# Patient Record
Sex: Male | Born: 1935
Health system: Southern US, Community
[De-identification: ages and names within clinical notes are randomized; demographics above are authoritative.]

## PROBLEM LIST (undated history)

## (undated) DIAGNOSIS — N400 Enlarged prostate without lower urinary tract symptoms: Secondary | ICD-10-CM

## (undated) DIAGNOSIS — I10 Essential (primary) hypertension: Secondary | ICD-10-CM

## (undated) DIAGNOSIS — E119 Type 2 diabetes mellitus without complications: Secondary | ICD-10-CM

## (undated) HISTORY — PX: COLONOSCOPY: SHX174

## (undated) HISTORY — PX: TONSILLECTOMY: SUR1361

---
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
## 2001-01-02 ENCOUNTER — Encounter (INDEPENDENT_AMBULATORY_CARE_PROVIDER_SITE_OTHER): Payer: Self-pay | Admitting: Specialist

## 2001-01-02 ENCOUNTER — Ambulatory Visit (HOSPITAL_COMMUNITY): Admission: RE | Admit: 2001-01-02 | Discharge: 2001-01-02 | Payer: Self-pay | Admitting: Gastroenterology

## 2004-02-11 ENCOUNTER — Encounter (INDEPENDENT_AMBULATORY_CARE_PROVIDER_SITE_OTHER): Payer: Self-pay | Admitting: *Deleted

## 2004-02-11 ENCOUNTER — Ambulatory Visit (HOSPITAL_COMMUNITY): Admission: RE | Admit: 2004-02-11 | Discharge: 2004-02-11 | Payer: Self-pay | Admitting: Gastroenterology

## 2005-03-01 HISTORY — PX: EYE SURGERY: SHX253

## 2009-11-20 ENCOUNTER — Encounter: Admission: RE | Admit: 2009-11-20 | Discharge: 2009-11-20 | Payer: Self-pay | Admitting: Internal Medicine

## 2010-07-17 NOTE — Procedures (Signed)
[facility]  Patient:    Dustin Henderson, Dustin Henderson Visit Number: [id number] MRN: [id number]          Service Type: END Location: ENDO Attending Physician:  Nelda Marseille Dictated by:   Petra Kuba, M.D. Proc. Date: 01/02/01 Admit Date:  01/02/2001   CC:         Soyla Murphy. Renne Crigler, M.D.   Procedure Report  PROCEDURE:  Colonoscopy with polypectomy.  INDICATIONS:  Screening.  Consent was signed after risks, benefits, methods and options were thoroughly discussed in my office.  MEDICATIONS USED:  Demerol 50, Versed 6.  DESCRIPTION OF PROCEDURE:  Rectal inspection was pertinent for external hemorrhoids.  Digital examination was negative.  The video colonoscope was inserted fairly easily and advanced around the colon to the cecum.  This did require rolling him on his back and some abdominal pressure.  There was some melanosis coli.  No obvious abnormality was seen on insertion.  The cecum was identified by the appendiceal orifice and the ileocecal valve.  The scope was slowly withdrawn.  In the hepatic flexure, a 4-5 mm sessile polyp was seen.  Snare electrocautery was applied and the polyp was suctioned through the scope and collected in the trap.  There was possibly a small bit of residual adenomatous tissue which was hot biopsied as well.  The scope was then slowly withdrawn.  No other obvious polypoid lesions, masses or other abnormalities were seen as we slowly withdrew back to the rectum except for the mild melanosis coli.  The prep was adequate.  There was some stool that was adherent to the wall and some nuts and things that could not be suctioned, but no other abnormality was seen.  Once back in the rectum, the scope was retroflexed pertinent for some internal hemorrhoids.  The scope was straightened and the air was suctioned. The scope was removed.  The patient tolerated the procedure well.  There was no evidence of any  complications.  ENDOSCOPIC DIAGNOSES: 1. Internal and external hemorrhoids. 2. Mild melanosis coli. 3. Small sessile hepatic flexure polyp status post snare and hot biopsy. 4. Otherwise, within normal limits to the cecum.  PLAN: 1. Await pathology to determine future colonic screen. 2. Will see back p.r.n. one week. 3. No aspirin or nonsteroidals. 4. Yearly rectals and guaiacs per Dr. Renne Crigler. 5. Will be happy to see her back sooner or p.r.n. Dictated by:   Petra Kuba, M.D. Attending Physician:  Nelda Marseille DD:  01/02/01 TD:  01/03/01 Job: [id number]

## 2010-07-17 NOTE — Op Note (Signed)
NAMEHYDER, Dustin Henderson             ACCOUNT NO.:  [id number]   MEDICAL RECORD NO.:  [id number]          PATIENT TYPE:  AMB   LOCATION:  ENDO                         FACILITY:  [facility]   PHYSICIAN:  Petra Kuba, M.D.    DATE OF BIRTH:  1933/03/19   DATE OF PROCEDURE:  02/11/2004  DATE OF DISCHARGE:                                 OPERATIVE REPORT   PROCEDURE:  Colonoscopy with polypectomy.   INDICATION:  The patient with history of colon polyps, due for repeat  screening.  Consent was signed after risks, benefits, methods, options  thoroughly discussed in the past.   MEDICINES USED:  1.  Demerol 70.  2.  Versed 7.   DESCRIPTION OF PROCEDURE:  Rectal inspection was pertinent for external  hemorrhoids.  Digital exam was negative.  Video colonoscope was inserted and  despite a long, looping, tortuous colon with abdominal pressure, was able to  be advanced to the cecum.  No abnormalities were seen on insertion.  The  cecum was identified by the appendiceal orifice and the ileocecal valve.  The prep was adequate.  There was some liquid stool that required washing  and suctioning.  In the mid ascending, a tiny to small polyp was seen.  We  tried to snare it initially but based on its location on the fold, it could  not be snared, and three hot biopsies of it were done, put in the first  container.  Another tiny ascending polyp was seen and was hot biopsied x 1  as well and put in the same container.  The scope was slowly withdrawn.  No  other polypoid tissues or other abnormalities were seen as we slowly  withdrew back to the rectum.  Unfortunately, in the mid descending, we got a  seed caught in the scope, could not flush it out, and we did have to switch  scopes, but no abnormalities were seen.  We did readvance the second scope  to the proximal level of the splenic flexure, past the area where we had to  withdraw, where we got the seed caught by suctioning.  Once back in the  rectum,  anorectal pull-through and retroflexion confirmed some small  hemorrhoids.  The scope was straightened and readvanced a short ways up the  left side of the colon; air was suctioned and scope removed.  The patient  tolerated the procedure well.  There was no obvious immediate complication.   ENDOSCOPIC DIAGNOSES:  1.  Internal/external hemorrhoids.  2.  Two tiny to small right-sided polyps, hot biopsied.  3.  Long, looping colon.  4.  Otherwise, within normal limits to the cecum.   PLAN:  1.  Await pathology but probably recheck colon screening in 5 years.  2.  Happy to see back p.r.n.  3.  Otherwise, return care to Dr. Renne Crigler for the customary health care      maintenance to include yearly rectals and guaiacs.      MEM/MEDQ  D:  02/11/2004  T:  02/11/2004  Job:  [id number]   cc:   Soyla Murphy. Renne Crigler, M.D.  [phone]  [address] [place]  [place] [address]  Fax: [phone]

## 2010-11-16 ENCOUNTER — Encounter (INDEPENDENT_AMBULATORY_CARE_PROVIDER_SITE_OTHER): Payer: Medicare Other | Admitting: Ophthalmology

## 2010-11-16 DIAGNOSIS — H35379 Puckering of macula, unspecified eye: Secondary | ICD-10-CM

## 2010-11-16 DIAGNOSIS — H43819 Vitreous degeneration, unspecified eye: Secondary | ICD-10-CM

## 2010-11-16 DIAGNOSIS — E11319 Type 2 diabetes mellitus with unspecified diabetic retinopathy without macular edema: Secondary | ICD-10-CM

## 2011-08-16 ENCOUNTER — Encounter (INDEPENDENT_AMBULATORY_CARE_PROVIDER_SITE_OTHER): Payer: Medicare Other | Admitting: Ophthalmology

## 2011-08-16 DIAGNOSIS — H35379 Puckering of macula, unspecified eye: Secondary | ICD-10-CM

## 2011-08-16 DIAGNOSIS — H43819 Vitreous degeneration, unspecified eye: Secondary | ICD-10-CM

## 2011-08-16 DIAGNOSIS — E11319 Type 2 diabetes mellitus with unspecified diabetic retinopathy without macular edema: Secondary | ICD-10-CM

## 2011-08-16 DIAGNOSIS — E1165 Type 2 diabetes mellitus with hyperglycemia: Secondary | ICD-10-CM

## 2011-08-16 DIAGNOSIS — E1139 Type 2 diabetes mellitus with other diabetic ophthalmic complication: Secondary | ICD-10-CM

## 2012-05-15 ENCOUNTER — Ambulatory Visit (INDEPENDENT_AMBULATORY_CARE_PROVIDER_SITE_OTHER): Payer: Self-pay | Admitting: Ophthalmology

## 2012-05-15 DIAGNOSIS — E11319 Type 2 diabetes mellitus with unspecified diabetic retinopathy without macular edema: Secondary | ICD-10-CM

## 2012-05-15 DIAGNOSIS — E1139 Type 2 diabetes mellitus with other diabetic ophthalmic complication: Secondary | ICD-10-CM

## 2012-05-15 DIAGNOSIS — E1165 Type 2 diabetes mellitus with hyperglycemia: Secondary | ICD-10-CM

## 2012-05-15 DIAGNOSIS — H35379 Puckering of macula, unspecified eye: Secondary | ICD-10-CM

## 2013-02-14 ENCOUNTER — Ambulatory Visit (INDEPENDENT_AMBULATORY_CARE_PROVIDER_SITE_OTHER): Payer: Medicare Other | Admitting: Ophthalmology

## 2013-02-14 DIAGNOSIS — E11319 Type 2 diabetes mellitus with unspecified diabetic retinopathy without macular edema: Secondary | ICD-10-CM

## 2013-02-14 DIAGNOSIS — E1139 Type 2 diabetes mellitus with other diabetic ophthalmic complication: Secondary | ICD-10-CM

## 2013-02-14 DIAGNOSIS — I10 Essential (primary) hypertension: Secondary | ICD-10-CM

## 2013-02-14 DIAGNOSIS — H35039 Hypertensive retinopathy, unspecified eye: Secondary | ICD-10-CM

## 2013-02-14 DIAGNOSIS — H43819 Vitreous degeneration, unspecified eye: Secondary | ICD-10-CM

## 2013-02-14 DIAGNOSIS — H35379 Puckering of macula, unspecified eye: Secondary | ICD-10-CM

## 2013-07-20 ENCOUNTER — Ambulatory Visit (HOSPITAL_COMMUNITY)
Admission: RE | Admit: 2013-07-20 | Discharge: 2013-07-20 | Disposition: A | Discharge status: Home / Self Care | Payer: Medicare Other | Source: Ambulatory Visit | Attending: Internal Medicine | Admitting: Internal Medicine

## 2013-07-20 ENCOUNTER — Other Ambulatory Visit: Payer: Self-pay | Admitting: Gastroenterology

## 2013-07-20 ENCOUNTER — Other Ambulatory Visit (HOSPITAL_COMMUNITY): Payer: Self-pay | Admitting: Internal Medicine

## 2013-07-20 DIAGNOSIS — R945 Abnormal results of liver function studies: Secondary | ICD-10-CM

## 2013-07-20 DIAGNOSIS — R7989 Other specified abnormal findings of blood chemistry: Secondary | ICD-10-CM

## 2013-07-20 DIAGNOSIS — R17 Unspecified jaundice: Secondary | ICD-10-CM

## 2013-07-20 DIAGNOSIS — R109 Unspecified abdominal pain: Secondary | ICD-10-CM | POA: Insufficient documentation

## 2013-07-20 DIAGNOSIS — K831 Obstruction of bile duct: Secondary | ICD-10-CM

## 2013-07-20 DIAGNOSIS — K3189 Other diseases of stomach and duodenum: Secondary | ICD-10-CM | POA: Insufficient documentation

## 2013-07-20 DIAGNOSIS — R110 Nausea: Secondary | ICD-10-CM | POA: Insufficient documentation

## 2013-07-20 DIAGNOSIS — R1084 Generalized abdominal pain: Secondary | ICD-10-CM

## 2013-07-20 DIAGNOSIS — R1013 Epigastric pain: Secondary | ICD-10-CM

## 2013-07-20 NOTE — Addendum Note (Signed)
Addended byVida Rigger on: 07/20/2013 06:36 PM   Modules accepted: Orders

## 2013-07-24 ENCOUNTER — Encounter (HOSPITAL_COMMUNITY): Payer: Medicare Other | Admitting: Anesthesiology

## 2013-07-24 ENCOUNTER — Encounter (HOSPITAL_COMMUNITY)
Admission: RE | Disposition: A | Discharge status: Home / Self Care | Payer: Self-pay | Source: Ambulatory Visit | Attending: Gastroenterology

## 2013-07-24 ENCOUNTER — Ambulatory Visit (HOSPITAL_COMMUNITY)
Admission: RE | Admit: 2013-07-24 | Discharge: 2013-07-24 | Disposition: A | Discharge status: Home / Self Care | Payer: Medicare Other | Source: Ambulatory Visit | Attending: Gastroenterology | Admitting: Gastroenterology

## 2013-07-24 ENCOUNTER — Ambulatory Visit (HOSPITAL_COMMUNITY): Admit: 2013-07-24 | Payer: Self-pay | Admitting: Gastroenterology

## 2013-07-24 ENCOUNTER — Ambulatory Visit (HOSPITAL_COMMUNITY): Payer: Medicare Other

## 2013-07-24 ENCOUNTER — Encounter (HOSPITAL_COMMUNITY): Payer: Self-pay

## 2013-07-24 ENCOUNTER — Other Ambulatory Visit: Payer: Self-pay | Admitting: Internal Medicine

## 2013-07-24 ENCOUNTER — Other Ambulatory Visit: Payer: Self-pay | Admitting: Gastroenterology

## 2013-07-24 ENCOUNTER — Ambulatory Visit (HOSPITAL_COMMUNITY): Payer: Medicare Other | Admitting: Anesthesiology

## 2013-07-24 DIAGNOSIS — K838 Other specified diseases of biliary tract: Secondary | ICD-10-CM | POA: Insufficient documentation

## 2013-07-24 DIAGNOSIS — Z79899 Other long term (current) drug therapy: Secondary | ICD-10-CM | POA: Insufficient documentation

## 2013-07-24 DIAGNOSIS — K831 Obstruction of bile duct: Secondary | ICD-10-CM | POA: Insufficient documentation

## 2013-07-24 DIAGNOSIS — R945 Abnormal results of liver function studies: Secondary | ICD-10-CM | POA: Insufficient documentation

## 2013-07-24 DIAGNOSIS — Z794 Long term (current) use of insulin: Secondary | ICD-10-CM | POA: Insufficient documentation

## 2013-07-24 DIAGNOSIS — I10 Essential (primary) hypertension: Secondary | ICD-10-CM | POA: Insufficient documentation

## 2013-07-24 DIAGNOSIS — Z9641 Presence of insulin pump (external) (internal): Secondary | ICD-10-CM | POA: Insufficient documentation

## 2013-07-24 DIAGNOSIS — E109 Type 1 diabetes mellitus without complications: Secondary | ICD-10-CM | POA: Insufficient documentation

## 2013-07-24 DIAGNOSIS — K828 Other specified diseases of gallbladder: Secondary | ICD-10-CM | POA: Insufficient documentation

## 2013-07-24 DIAGNOSIS — R109 Unspecified abdominal pain: Secondary | ICD-10-CM

## 2013-07-24 HISTORY — PX: EUS: SHX5427

## 2013-07-24 HISTORY — DX: Essential (primary) hypertension: I10

## 2013-07-24 HISTORY — DX: Type 2 diabetes mellitus without complications: E11.9

## 2013-07-24 HISTORY — PX: ERCP: SHX5425

## 2013-07-24 LAB — TYPE AND SCREEN
ABO/RH(D): O POS
Antibody Screen: NEGATIVE

## 2013-07-24 LAB — PROTIME-INR
INR: 1.04 (ref 0.00–1.49)
Prothrombin Time: 13.4 seconds (ref 11.6–15.2)

## 2013-07-24 LAB — ABO/RH: ABO/RH(D): O POS

## 2013-07-24 LAB — CBC
HCT: 38 % — ABNORMAL LOW (ref 39.0–52.0)
HEMOGLOBIN: 13.1 g/dL (ref 13.0–17.0)
MCH: 31.2 pg (ref 26.0–34.0)
MCHC: 34.5 g/dL (ref 30.0–36.0)
MCV: 90.5 fL (ref 78.0–100.0)
Platelets: 199 10*3/uL (ref 150–400)
RBC: 4.2 MIL/uL — ABNORMAL LOW (ref 4.22–5.81)
RDW: 17.6 % — ABNORMAL HIGH (ref 11.5–15.5)
WBC: 7.5 10*3/uL (ref 4.0–10.5)

## 2013-07-24 LAB — COMPREHENSIVE METABOLIC PANEL
ALK PHOS: 370 U/L — AB (ref 39–117)
ALT: 89 U/L — ABNORMAL HIGH (ref 0–53)
AST: 117 U/L — ABNORMAL HIGH (ref 0–37)
Albumin: 2.8 g/dL — ABNORMAL LOW (ref 3.5–5.2)
BUN: 17 mg/dL (ref 6–23)
CO2: 25 mEq/L (ref 19–32)
CREATININE: 0.78 mg/dL (ref 0.50–1.35)
Calcium: 9.3 mg/dL (ref 8.4–10.5)
Chloride: 100 mEq/L (ref 96–112)
GFR, EST NON AFRICAN AMERICAN: 85 mL/min — AB (ref >90)
GLUCOSE: 222 mg/dL — AB (ref 70–99)
POTASSIUM: 4.2 meq/L (ref 3.7–5.3)
Sodium: 138 mEq/L (ref 137–147)
Total Bilirubin: 21.2 mg/dL (ref 0.3–1.2)
Total Protein: 5.8 g/dL — ABNORMAL LOW (ref 6.0–8.3)

## 2013-07-24 LAB — GLUCOSE, CAPILLARY
Glucose-Capillary: 191 mg/dL — ABNORMAL HIGH (ref 70–99)
Glucose-Capillary: 203 mg/dL — ABNORMAL HIGH (ref 70–99)

## 2013-07-24 SURGERY — ESOPHAGEAL ENDOSCOPIC ULTRASOUND (EUS) RADIAL
Anesthesia: General

## 2013-07-24 SURGERY — ERCP, WITH INTERVENTION IF INDICATED
Anesthesia: General

## 2013-07-24 MED ORDER — ONDANSETRON HCL 4 MG/2ML IJ SOLN
INTRAMUSCULAR | Status: DC | PRN
  Administered 2013-07-24: 4 mg via INTRAVENOUS

## 2013-07-24 MED ORDER — PROPOFOL 10 MG/ML IV BOLUS
INTRAVENOUS | Status: AC
  Filled 2013-07-24: qty 20

## 2013-07-24 MED ORDER — BUTAMBEN-TETRACAINE-BENZOCAINE 2-2-14 % EX AERO
INHALATION_SPRAY | CUTANEOUS | Status: DC | PRN
  Administered 2013-07-24: 2 via TOPICAL

## 2013-07-24 MED ORDER — SODIUM CHLORIDE 0.9 % IV SOLN: INTRAVENOUS | Status: DC

## 2013-07-24 MED ORDER — DEXTROSE 5 % IV SOLN
2.0000 g | Freq: Once | INTRAVENOUS | Status: AC
  Administered 2013-07-24: 2 g via INTRAVENOUS
  Filled 2013-07-24: qty 2

## 2013-07-24 MED ORDER — FENTANYL CITRATE 0.05 MG/ML IJ SOLN
INTRAMUSCULAR | Status: DC | PRN
  Administered 2013-07-24: 50 ug via INTRAVENOUS

## 2013-07-24 MED ORDER — LIDOCAINE HCL (CARDIAC) 20 MG/ML IV SOLN
INTRAVENOUS | Status: AC
  Filled 2013-07-24: qty 5

## 2013-07-24 MED ORDER — ONDANSETRON HCL 4 MG/2ML IJ SOLN
INTRAMUSCULAR | Status: AC
  Filled 2013-07-24: qty 2

## 2013-07-24 MED ORDER — LIDOCAINE HCL (CARDIAC) 20 MG/ML IV SOLN
INTRAVENOUS | Status: DC | PRN
  Administered 2013-07-24: 50 mg via INTRAVENOUS

## 2013-07-24 MED ORDER — FENTANYL CITRATE 0.05 MG/ML IJ SOLN: 25.0000 ug | INTRAMUSCULAR | Status: DC | PRN

## 2013-07-24 MED ORDER — LACTATED RINGERS IV SOLN
INTRAVENOUS | Status: DC
  Administered 2013-07-24 (×2): via INTRAVENOUS

## 2013-07-24 MED ORDER — LIDOCAINE HCL (CARDIAC) 20 MG/ML IV SOLN
INTRAVENOUS | Status: AC
Start: 2013-07-24 — End: 2013-07-24
  Filled 2013-07-24: qty 5

## 2013-07-24 MED ORDER — PHENYLEPHRINE HCL 10 MG/ML IJ SOLN
INTRAMUSCULAR | Status: DC | PRN
  Administered 2013-07-24 (×3): 40 ug via INTRAVENOUS

## 2013-07-24 MED ORDER — SODIUM CHLORIDE 0.9 % IV SOLN
INTRAVENOUS | Status: DC | PRN
  Administered 2013-07-24: 14:00:00

## 2013-07-24 MED ORDER — SUCCINYLCHOLINE CHLORIDE 20 MG/ML IJ SOLN
INTRAMUSCULAR | Status: DC | PRN
  Administered 2013-07-24: 100 mg via INTRAVENOUS

## 2013-07-24 MED ORDER — FENTANYL CITRATE 0.05 MG/ML IJ SOLN
INTRAMUSCULAR | Status: AC
  Filled 2013-07-24: qty 2

## 2013-07-24 MED ORDER — PROPOFOL 10 MG/ML IV BOLUS
INTRAVENOUS | Status: DC | PRN
  Administered 2013-07-24: 30 mg via INTRAVENOUS
  Administered 2013-07-24: 150 mg via INTRAVENOUS
  Administered 2013-07-24: 50 mg via INTRAVENOUS
  Administered 2013-07-24: 30 mg via INTRAVENOUS

## 2013-07-24 NOTE — Progress Notes (Signed)
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

## 2013-07-24 NOTE — Op Note (Signed)
[facility] [address] [place], [address]   ENDOSCOPIC ULTRASOUND PROCEDURE REPORT  PATIENT: Dustin Henderson, Dustin Henderson  MR#: [id number] BIRTHDATE: 1933-09-13  GENDER: Male ENDOSCOPIST: Willis Modena, MD REFERRED BY:  Romero Liner, M.D.  Vida Rigger, M.D. PROCEDURE DATE:  07/24/2013 PROCEDURE:   Upper EUS ASA CLASS:      Class III INDICATIONS:   1.  obstructive jaundice. MEDICATIONS: General endotracheal anesthesia (GETA)  DESCRIPTION OF PROCEDURE:   After the risks benefits and alternatives of the procedure were  explained, informed consent was obtained. The patient was then placed in the left, lateral, decubitus postion and IV sedation was administered. Throughout the procedure, the patients blood pressure, pulse and oxygen saturations were monitored continuously.  Under direct visualization, the linear oblique-viewing  echoendoscope was introduced through the mouth  and advanced to the second portion of the duodenum .  Water was used as necessary to provide an acoustic interface.  Upon completion of the imaging, water was removed and the patient was sent to the recovery room in satisfactory condition.    FINDINGS:      Ampulla normal-appearing via endoscopic ultrasound and endoscopically.  The extrahepatic bile duct was diffusely dilated (52mm common hepatic duct, 74mm distal common bile duct). The gallbladder was dilated and filled with sludge.  The bile duct had lots of gravity-dependent sludge.  In the very distal common bile duct, there was a 61mm region most consistent with a poorly-shadowing stone.  The bile duct is diffusely dilated right to the point of the ampulla, at which point the duct abruptly terminates. There is no bile duct wall irregularity, and no evidence of bile duct mass seen on EUS.  I evaluated the uncinate and head of pancreas extensively; I did not see any evidence of a pancreatic mass in this area either.   No adenopathy was seen  in the region of the head, uncinate and ampulla.  IMPRESSION:     As above.  No evidence of pancreatic mass. Differential considerations include gallstone-related papillary stenosis versus very small distal bile duct stricture (benign stone-related versus very subtle cholangiocarcinoma) versus very small pancreatic mass not visible on EUS (unlikely).  RECOMMENDATIONS:     1.  Watch for potential complications of procedure. 2.  Proceed with ERCP today with Dr. Ewing Schlein. 3.  Case reviewed with Dr. Ewing Schlein.   _______________________________ Rosalie DoctorWillis Modena, MD 07/24/2013 12:55 PM   CC:

## 2013-07-24 NOTE — Transfer of Care (Signed)
Immediate Anesthesia Transfer of Care Note  Patient: Dustin Henderson  Procedure(s) Performed: Procedure(s) with comments: ENDOSCOPIC RETROGRADE CHOLANGIOPANCREATOGRAPHY (ERCP) (N/A) - have stents available  Patient Location: PACU  Anesthesia Type:General  Level of Consciousness: awake, alert  and oriented  Airway & Oxygen Therapy: Patient Spontanous Breathing and Patient connected to face mask oxygen  Post-op Assessment: Report given to PACU RN and Post -op Vital signs reviewed and stable  Post vital signs: Reviewed and stable  Complications: No apparent anesthesia complications

## 2013-07-24 NOTE — Progress Notes (Signed)
CRITICAL VALUE ALERT  Critical value received:  t bili  Date of notification:  07/24/13  Time of notification:  1225  Critical value read back:yes  Nurse who received alert:  Riki Rusk, RN  MD notified (1st page):  Dr Ewing Schlein  Time of first page:  1225  MD notified (2nd page):  Time of second page:  Responding MD:  Dr Ewing Schlein  Time MD responded:  1225

## 2013-07-24 NOTE — Op Note (Signed)
[facility] [address] [place], [address]   ERCP PROCEDURE REPORT  PATIENT: Dustin Henderson, Dustin Henderson  MR# :[id number] BIRTHDATE: 09/25/1933  GENDER: Male ENDOSCOPIST: Vida Rigger, MD REFERRED BY: Romero Liner, M.D. PROCEDURE DATE:  07/24/2013 PROCEDURE:   ERCP with sphincterotomy/papillotomy, ERCP with stent placement, and ERCP with balloon dilation and brushing ASA CLASS:    2 INDICATIONS: obstructive jaundice MEDICATIONS:   general anesthesia TOPICAL ANESTHETIC:  none  DESCRIPTION OF PROCEDURE:   After the risks benefits and alternatives of the procedure were thoroughly explained, informed consent was obtained.  The ercp pentax [id number]  endoscope was introduced through the mouth and advanced to the second portion of the duodenum .I watched most of Dr. Dulce Sellar as EUS prior to our ERCP and deep selective cannulation was obtained and a massively dilated CBD was confirmed with the very distal short stricture and we went ahead and proceeded with a sphincterotomy x2which was increased after we're unable to withdrawal the balloon the first time in the customary but despite multiple attempts at withdrawing the 12-15 mm adjustable balloon it continued to get caught up in the stricture so we went ahead and balloon dilated the stricture using the 8 mm balloon but we still could not readily pass the balloon so we sent the balloon dilating catheter for cytology and went ahead and brushed the distal duct and then placed a 4 cm metal removable stent in the distal CBD with adequate biliary drainage in the customary fashion and no CBD stones were seen and no pancreatic injections or wires advancements were done throughout the procedure the scope was removed and the patient tolerated the procedure well there was no obvious immediate complication           COMPLICATIONS:  none  ENDOSCOPIC IMPRESSION: 1.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 No obvious CBD stone seen that unable to withdraw adjustable balloon as above past the stricture without decreasing the balloon 4 no Pd wire advancements or injections as above  RECOMMENDATIONS:customary post-ERCP care and instructions and will see back in the office next week to repeat liver tests and probably consider CAT scan just to be sure and await cytology in the meantime     _______________________________ eSigned:  Vida Rigger, MD 07/24/2013 2:29 PM   TX:MIWOEH D Renne Crigler, MD  PATIENT NAMEDeionte, Delledonne MR#: [id number]

## 2013-07-24 NOTE — Discharge Instructions (Signed)
Call if question or problem otherwise if doing well at 4 PM may have sips of clear liquid and if doing well at 8 PM may have soft solids and slowly advanced diet. Specifically call if increased pain nausea vomiting fever or signs of GI bleeding otherwise followup with me in the office next week to recheck labs and decide any other workup and plans like probable CAT scan and we will call if we get the tissue sample resultsEndoscopic Retrograde Cholangiopancreatography (ERCP), Care After Refer to this sheet in the next few weeks. These instructions provide you with information on caring for yourself after your procedure. Your health care provider may also give you more specific instructions. Your treatment has been planned according to current medical practices, but problems sometimes occur. Call your health care provider if you have any problems or questions after your procedure.  WHAT TO EXPECT AFTER THE PROCEDURE  After your procedure, it is typical to feel:   Soreness in your throat.   Sick to your stomach (nauseous).   Bloated.  Dizzy.   Fatigued. HOME CARE INSTRUCTIONS  Have a friend or family member stay with you for the first 24 hours after your procedure.  Start taking your usual medicines and eating normally as soon as you feel well enough to do so or as directed by your health care provider. SEEK MEDICAL CARE IF:  You have abdominal pain.   You develop signs of infection, such as:   Chills.   Feeling unwell.  SEEK IMMEDIATE MEDICAL CARE IF:  You have difficulty swallowing.  You have worsening throat, chest, or abdominal pain.  You vomit.  You have bloody or very black stools.  You have a fever. Document Released: 12/06/2012 Document Reviewed: 08/21/2012 [facility] Patient Information 2014 [place], [place]. General Anesthesia, Adult, Care After Refer to this sheet in the next few weeks. These instructions provide you with information on caring for yourself after  your procedure. Your health care provider may also give you more specific instructions. Your treatment has been planned according to current medical practices, but problems sometimes occur. Call your health care provider if you have any problems or questions after your procedure. WHAT TO EXPECT AFTER THE PROCEDURE After the procedure, it is typical to experience:  Sleepiness.  Nausea and vomiting. HOME CARE INSTRUCTIONS  For the first 24 hours after general anesthesia:  Have a responsible person with you.  Do not drive a car. If you are alone, do not take public transportation.  Do not drink alcohol.  Do not take medicine that has not been prescribed by your health care provider.  Do not sign important papers or make important decisions.  You may resume a normal diet and activities as directed by your health care provider.  Change bandages (dressings) as directed.  If you have questions or problems that seem related to general anesthesia, call the hospital and ask for the anesthetist or anesthesiologist on call. SEEK MEDICAL CARE IF:  You have nausea and vomiting that continue the day after anesthesia.  You develop a rash. SEEK IMMEDIATE MEDICAL CARE IF:   You have difficulty breathing.  You have chest pain.  You have any allergic problems. Document Released: 05/24/2000 Document Revised: 10/18/2012 Document Reviewed: 08/31/2012 [facility] Patient Information 2014 [place], [place].

## 2013-07-24 NOTE — Anesthesia Preprocedure Evaluation (Addendum)
Anesthesia Evaluation  Patient identified by MRN, date of birth, ID band Patient awake    Reviewed: Allergy & Precautions, H&P , NPO status , Patient's Chart, lab work & pertinent test results  Airway Mallampati: II TM Distance: >3 FB Neck ROM: Full    Dental no notable dental hx.    Pulmonary neg pulmonary ROS,  breath sounds clear to auscultation  Pulmonary exam normal       Cardiovascular hypertension, Pt. on medications Rhythm:Regular Rate:Normal     Neuro/Psych negative neurological ROS  negative psych ROS   GI/Hepatic negative GI ROS, Elevated liver function tests.   Endo/Other  diabetes, Type 1, Insulin DependentInsulin pump at home.  Renal/GU negative Renal ROS  negative genitourinary   Musculoskeletal negative musculoskeletal ROS (+)   Abdominal   Peds negative pediatric ROS (+)  Hematology negative hematology ROS (+)   Anesthesia Other Findings   Reproductive/Obstetrics negative OB ROS                         Anesthesia Physical Anesthesia Plan  ASA: III  Anesthesia Plan: General   Post-op Pain Management:    Induction: Intravenous  Airway Management Planned: Oral ETT  Additional Equipment:   Intra-op Plan:   Post-operative Plan: Extubation in OR  Informed Consent: I have reviewed the patients History and Physical, chart, labs and discussed the procedure including the risks, benefits and alternatives for the proposed anesthesia with the patient or authorized representative who has indicated his/her understanding and acceptance.   Dental advisory given  Plan Discussed with: CRNA  Anesthesia Plan Comments:        Anesthesia Quick Evaluation

## 2013-07-25 ENCOUNTER — Encounter (HOSPITAL_COMMUNITY): Payer: Self-pay | Admitting: Gastroenterology

## 2013-07-25 NOTE — Anesthesia Postprocedure Evaluation (Signed)
  Anesthesia Post-op Note  Patient: Dustin Henderson  Procedure(s) Performed: Procedure(s) (LRB): ENDOSCOPIC RETROGRADE CHOLANGIOPANCREATOGRAPHY (ERCP) (N/A) ESOPHAGEAL ENDOSCOPIC ULTRASOUND (EUS) RADIAL  Patient Location: PACU  Anesthesia Type: General  Level of Consciousness: awake and alert   Airway and Oxygen Therapy: Patient Spontanous Breathing  Post-op Pain: mild  Post-op Assessment: Post-op Vital signs reviewed, Patient's Cardiovascular Status Stable, Respiratory Function Stable, Patent Airway and No signs of Nausea or vomiting  Last Vitals:  Filed Vitals:   07/24/13 1530  BP: 114/50  Temp:   Resp: 17    Post-op Vital Signs: stable   Complications: No apparent anesthesia complications

## 2013-07-30 ENCOUNTER — Other Ambulatory Visit: Payer: Self-pay | Admitting: Gastroenterology

## 2013-07-30 DIAGNOSIS — R740 Nonspecific elevation of levels of transaminase and lactic acid dehydrogenase [LDH]: Principal | ICD-10-CM

## 2013-07-30 DIAGNOSIS — R7401 Elevation of levels of liver transaminase levels: Secondary | ICD-10-CM

## 2013-07-30 DIAGNOSIS — R7402 Elevation of levels of lactic acid dehydrogenase (LDH): Secondary | ICD-10-CM

## 2013-08-03 ENCOUNTER — Ambulatory Visit
Admission: RE | Admit: 2013-08-03 | Discharge: 2013-08-03 | Disposition: A | Discharge status: Home / Self Care | Payer: Medicare Other | Source: Ambulatory Visit | Attending: Gastroenterology | Admitting: Gastroenterology

## 2013-08-03 DIAGNOSIS — R7401 Elevation of levels of liver transaminase levels: Secondary | ICD-10-CM

## 2013-08-03 DIAGNOSIS — R740 Nonspecific elevation of levels of transaminase and lactic acid dehydrogenase [LDH]: Principal | ICD-10-CM

## 2013-08-03 MED ORDER — IOHEXOL 300 MG/ML  SOLN
100.0000 mL | Freq: Once | INTRAMUSCULAR | Status: AC | PRN
  Administered 2013-08-03: 100 mL via INTRAVENOUS

## 2013-08-30 ENCOUNTER — Encounter (HOSPITAL_COMMUNITY): Payer: Self-pay | Admitting: Pharmacy Technician

## 2013-09-10 ENCOUNTER — Encounter (HOSPITAL_COMMUNITY): Payer: Self-pay | Admitting: *Deleted

## 2013-09-12 ENCOUNTER — Other Ambulatory Visit: Payer: Self-pay | Admitting: Gastroenterology

## 2013-09-13 NOTE — Addendum Note (Signed)
Addended by: Grove Defina on: 09/13/2013 08:13 AM   Modules accepted: Orders  

## 2013-09-19 ENCOUNTER — Encounter (HOSPITAL_COMMUNITY): Payer: Self-pay | Admitting: Anesthesiology

## 2013-09-19 NOTE — Anesthesia Preprocedure Evaluation (Addendum)
Anesthesia Evaluation  Patient identified by MRN, date of birth, ID band Patient awake    Reviewed: Allergy & Precautions, H&P , NPO status , Patient's Chart, lab work & pertinent test results  Airway Mallampati: II TM Distance: >3 FB Neck ROM: Full    Dental no notable dental hx.    Pulmonary neg pulmonary ROS,  breath sounds clear to auscultation  Pulmonary exam normal       Cardiovascular hypertension, Pt. on medications negative cardio ROS  Rhythm:Regular Rate:Normal     Neuro/Psych negative neurological ROS  negative psych ROS   GI/Hepatic negative GI ROS, Neg liver ROS,   Endo/Other  diabetes, Type 1, Insulin Dependent  Renal/GU negative Renal ROS  negative genitourinary   Musculoskeletal negative musculoskeletal ROS (+)   Abdominal   Peds negative pediatric ROS (+)  Hematology negative hematology ROS (+)   Anesthesia Other Findings   Reproductive/Obstetrics negative OB ROS                          Anesthesia Physical Anesthesia Plan  ASA: III  Anesthesia Plan: MAC   Post-op Pain Management:    Induction: Intravenous  Airway Management Planned: Oral ETT  Additional Equipment:   Intra-op Plan:   Post-operative Plan: Extubation in OR  Informed Consent: I have reviewed the patients History and Physical, chart, labs and discussed the procedure including the risks, benefits and alternatives for the proposed anesthesia with the patient or authorized representative who has indicated his/her understanding and acceptance.   Dental advisory given  Plan Discussed with: CRNA  Anesthesia Plan Comments: (MAC with GA backup)       Anesthesia Quick Evaluation

## 2013-09-20 ENCOUNTER — Encounter (HOSPITAL_COMMUNITY): Payer: Self-pay | Admitting: Anesthesiology

## 2013-09-20 ENCOUNTER — Ambulatory Visit (HOSPITAL_COMMUNITY): Payer: Medicare Other

## 2013-09-20 ENCOUNTER — Encounter (HOSPITAL_COMMUNITY)
Admission: RE | Disposition: A | Discharge status: Home / Self Care | Payer: Self-pay | Source: Ambulatory Visit | Attending: Gastroenterology

## 2013-09-20 ENCOUNTER — Ambulatory Visit (HOSPITAL_COMMUNITY)
Admission: RE | Admit: 2013-09-20 | Discharge: 2013-09-20 | Disposition: A | Discharge status: Home / Self Care | Payer: Medicare Other | Source: Ambulatory Visit | Attending: Gastroenterology | Admitting: Gastroenterology

## 2013-09-20 ENCOUNTER — Encounter (HOSPITAL_COMMUNITY): Payer: Medicare Other | Admitting: Anesthesiology

## 2013-09-20 ENCOUNTER — Ambulatory Visit (HOSPITAL_COMMUNITY): Payer: Medicare Other | Admitting: Anesthesiology

## 2013-09-20 DIAGNOSIS — K838 Other specified diseases of biliary tract: Secondary | ICD-10-CM | POA: Diagnosis not present

## 2013-09-20 DIAGNOSIS — I10 Essential (primary) hypertension: Secondary | ICD-10-CM | POA: Diagnosis not present

## 2013-09-20 DIAGNOSIS — E109 Type 1 diabetes mellitus without complications: Secondary | ICD-10-CM | POA: Insufficient documentation

## 2013-09-20 DIAGNOSIS — Z79899 Other long term (current) drug therapy: Secondary | ICD-10-CM | POA: Insufficient documentation

## 2013-09-20 DIAGNOSIS — Z4682 Encounter for fitting and adjustment of non-vascular catheter: Secondary | ICD-10-CM | POA: Insufficient documentation

## 2013-09-20 DIAGNOSIS — Z794 Long term (current) use of insulin: Secondary | ICD-10-CM | POA: Diagnosis not present

## 2013-09-20 HISTORY — DX: Benign prostatic hyperplasia without lower urinary tract symptoms: N40.0

## 2013-09-20 HISTORY — PX: SPYGLASS CHOLANGIOSCOPY: SHX5441

## 2013-09-20 HISTORY — PX: ERCP: SHX5425

## 2013-09-20 LAB — CBC WITH DIFFERENTIAL/PLATELET
Basophils Absolute: 0.1 10*3/uL (ref 0.0–0.1)
Basophils Relative: 1 % (ref 0–1)
Eosinophils Absolute: 0.3 10*3/uL (ref 0.0–0.7)
Eosinophils Relative: 4 % (ref 0–5)
HEMATOCRIT: 36.6 % — AB (ref 39.0–52.0)
Hemoglobin: 12.7 g/dL — ABNORMAL LOW (ref 13.0–17.0)
LYMPHS PCT: 60 % — AB (ref 12–46)
Lymphs Abs: 4.4 10*3/uL — ABNORMAL HIGH (ref 0.7–4.0)
MCH: 31.2 pg (ref 26.0–34.0)
MCHC: 34.7 g/dL (ref 30.0–36.0)
MCV: 89.9 fL (ref 78.0–100.0)
MONO ABS: 0.3 10*3/uL (ref 0.1–1.0)
Monocytes Relative: 4 % (ref 3–12)
Neutro Abs: 2.2 10*3/uL (ref 1.7–7.7)
Neutrophils Relative %: 31 % — ABNORMAL LOW (ref 43–77)
Platelets: 148 10*3/uL — ABNORMAL LOW (ref 150–400)
RBC: 4.07 MIL/uL — AB (ref 4.22–5.81)
RDW: 13.1 % (ref 11.5–15.5)
WBC: 7.2 10*3/uL (ref 4.0–10.5)

## 2013-09-20 LAB — COMPREHENSIVE METABOLIC PANEL
ALT: 14 U/L (ref 0–53)
AST: 23 U/L (ref 0–37)
Albumin: 3.5 g/dL (ref 3.5–5.2)
Alkaline Phosphatase: 80 U/L (ref 39–117)
Anion gap: 12 (ref 5–15)
BILIRUBIN TOTAL: 1.4 mg/dL — AB (ref 0.3–1.2)
BUN: 24 mg/dL — ABNORMAL HIGH (ref 6–23)
CHLORIDE: 95 meq/L — AB (ref 96–112)
CO2: 27 meq/L (ref 19–32)
CREATININE: 0.87 mg/dL (ref 0.50–1.35)
Calcium: 9.1 mg/dL (ref 8.4–10.5)
GFR calc Af Amer: >90 mL/min (ref >90)
GFR, EST NON AFRICAN AMERICAN: 81 mL/min — AB (ref >90)
Glucose, Bld: 314 mg/dL — ABNORMAL HIGH (ref 70–99)
Potassium: 4.3 mEq/L (ref 3.7–5.3)
SODIUM: 134 meq/L — AB (ref 137–147)
Total Protein: 6.1 g/dL (ref 6.0–8.3)

## 2013-09-20 LAB — PROTIME-INR
INR: 1.01 (ref 0.00–1.49)
Prothrombin Time: 13.3 seconds (ref 11.6–15.2)

## 2013-09-20 LAB — GLUCOSE, CAPILLARY: Glucose-Capillary: 284 mg/dL — ABNORMAL HIGH (ref 70–99)

## 2013-09-20 LAB — TYPE AND SCREEN
ABO/RH(D): O POS
Antibody Screen: NEGATIVE

## 2013-09-20 SURGERY — ERCP, WITH INTERVENTION IF INDICATED
Anesthesia: Monitor Anesthesia Care

## 2013-09-20 MED ORDER — PROPOFOL 10 MG/ML IV BOLUS
INTRAVENOUS | Status: AC
  Filled 2013-09-20: qty 20

## 2013-09-20 MED ORDER — LABETALOL HCL 5 MG/ML IV SOLN
INTRAVENOUS | Status: DC | PRN
  Administered 2013-09-20: 5 mg via INTRAVENOUS

## 2013-09-20 MED ORDER — PROPOFOL 10 MG/ML IV BOLUS
INTRAVENOUS | Status: DC | PRN
  Administered 2013-09-20: 150 mg via INTRAVENOUS

## 2013-09-20 MED ORDER — PHENYLEPHRINE HCL 10 MG/ML IJ SOLN
INTRAMUSCULAR | Status: DC | PRN
  Administered 2013-09-20 (×2): 40 ug via INTRAVENOUS

## 2013-09-20 MED ORDER — GLYCOPYRROLATE 0.2 MG/ML IJ SOLN
INTRAMUSCULAR | Status: AC
  Filled 2013-09-20: qty 1

## 2013-09-20 MED ORDER — DEXTROSE 5 % IV SOLN
1.0000 g | Freq: Once | INTRAVENOUS | Status: AC
  Administered 2013-09-20: 2 g via INTRAVENOUS
  Filled 2013-09-20: qty 1

## 2013-09-20 MED ORDER — LIDOCAINE HCL (CARDIAC) 20 MG/ML IV SOLN
INTRAVENOUS | Status: AC
  Filled 2013-09-20: qty 5

## 2013-09-20 MED ORDER — FENTANYL CITRATE 0.05 MG/ML IJ SOLN
INTRAMUSCULAR | Status: DC | PRN
  Administered 2013-09-20 (×2): 50 ug via INTRAVENOUS

## 2013-09-20 MED ORDER — LABETALOL HCL 5 MG/ML IV SOLN
INTRAVENOUS | Status: AC
  Filled 2013-09-20: qty 4

## 2013-09-20 MED ORDER — GLYCOPYRROLATE 0.2 MG/ML IJ SOLN
INTRAMUSCULAR | Status: DC | PRN
  Administered 2013-09-20: 0.2 mg via INTRAVENOUS

## 2013-09-20 MED ORDER — IOHEXOL 350 MG/ML SOLN
INTRAVENOUS | Status: DC | PRN
  Administered 2013-09-20: 09:00:00

## 2013-09-20 MED ORDER — FENTANYL CITRATE 0.05 MG/ML IJ SOLN
INTRAMUSCULAR | Status: AC
  Filled 2013-09-20: qty 2

## 2013-09-20 MED ORDER — LACTATED RINGERS IV SOLN
INTRAVENOUS | Status: DC | PRN
  Administered 2013-09-20: 08:00:00 via INTRAVENOUS

## 2013-09-20 MED ORDER — SUCCINYLCHOLINE CHLORIDE 20 MG/ML IJ SOLN
INTRAMUSCULAR | Status: DC | PRN
  Administered 2013-09-20: 100 mg via INTRAVENOUS

## 2013-09-20 MED ORDER — LIDOCAINE HCL (CARDIAC) 20 MG/ML IV SOLN
INTRAVENOUS | Status: DC | PRN
  Administered 2013-09-20: 100 mg via INTRAVENOUS

## 2013-09-20 MED ORDER — SODIUM CHLORIDE 0.9 % IV SOLN: INTRAVENOUS | Status: DC

## 2013-09-20 MED ORDER — PHENYLEPHRINE 40 MCG/ML (10ML) SYRINGE FOR IV PUSH (FOR BLOOD PRESSURE SUPPORT)
PREFILLED_SYRINGE | INTRAVENOUS | Status: AC
  Filled 2013-09-20: qty 10

## 2013-09-20 NOTE — Transfer of Care (Signed)
Immediate Anesthesia Transfer of Care Note  Patient: Dustin Henderson  Procedure(s) Performed: Procedure(s): ENDOSCOPIC RETROGRADE CHOLANGIOPANCREATOGRAPHY (ERCP) (N/A) SPYGLASS CHOLANGIOSCOPY (N/A)  Patient Location: PACU  Anesthesia Type:General  Level of Consciousness: awake and oriented  Airway & Oxygen Therapy: Patient Spontanous Breathing and Patient connected to nasal cannula oxygen  Post-op Assessment: Report given to PACU RN and Post -op Vital signs reviewed and stable  Post vital signs: Reviewed and stable  Complications: No apparent anesthesia complications

## 2013-09-20 NOTE — Progress Notes (Signed)
Dustin Henderson 7:47 AM  Subjective: Patient asymptomatic from a GI standpoint and no medical problems since I saw Dustin Henderson in the office  Objective: Vital signs stable afebrile no acute distress exam please see pre-assessment evaluation  Assessment: Obstructive jaundice status post stent  Plan: Okay to proceed with stent removal and repeat ERCP to reevaluate questionable stricture  Dustin Henderson E

## 2013-09-20 NOTE — Op Note (Signed)
[facility] [address] [place], [id number]   ERCP PROCEDURE REPORT  PATIENT: Dustin CuriaCockrell, Rashaun  MR# :[id number] BIRTHDATE: 01-Nov-1933  GENDER: Male ENDOSCOPIST: Vida RiggerMarc Jinny Sweetland, MD REFERRED BY: Romero LinerWalter D Pharr, M.D. PROCEDURE DATE:  09/20/2013 PROCEDURE:   ERCP with biopsy ASA CLASS:    2 INDICATIONS: CBD stricture MEDICATIONS:  Gen. TOPICAL ANESTHETIC:  no  DESCRIPTION OF PROCEDURE:   After the risks benefits and alternatives of the procedure were thoroughly explained, informed consent was obtained.  The ercp pentax [id number]  endoscope was introduced through the mouth and advanced to the second portion of the duodenum .the old stent was brought into view and removed using the rat-tooth forceps in the customary fashion and the stent was sent for cytology and the scope was reinserted and easily cannulated with the adjustable balloon and we proceeded with multiple balloon pull-throughs was some old debris being delivered but no obvious stone and no resistance to the 12 mm balloon and only minimal resistance to the 15 mm balloon and there was adequate biliary drainage and no obvious finding on occlusion cholangiogram and the JAG Jagwire was inserted into the intrahepatics and the balloon catheter was exchanged for the spyglass mother-daughter scope and excellent visualization of the CBD was obtained on multiple advanced and withdrawals and no obvious mass lesion was seen and we did take a few biopsies of the edematous area of the distal duct and the wire and the smaller scope was removed and then the ERCP scope was removed and the patient tolerated the procedure well there was no obvious immediate complications and there was no pancreatic duct injection or wire enhancement throughout the procedure         COMPLICATIONS: none  ENDOSCOPIC IMPRESSION:1 status post metal stent removal and sent for cytology 2 minimal debris removed on multiple balloon  pull-through as above 3 essentially negative CBD enteroscope with a few biopsies of the edematous distal duct 4. No pancreatic duct injection or wire advancement  RECOMMENDATIONS:observe for delayed complications slowly advance diet await cytology and biopsy and followup in 2 months if doing well or when necessary     _______________________________ eSigned:  Vida RiggerMarc Federico Maiorino, MD 09/20/2013 9:13 AM   AO:ZHYQMVCC:Walter D Renne CriglerPharr, MD  PATIENT NAMSimone Henderson:  Silveria, Deandre MR#: [id number]

## 2013-09-20 NOTE — Anesthesia Postprocedure Evaluation (Signed)
  Anesthesia Post-op Note  Patient: Dustin Henderson  Procedure(s) Performed: Procedure(s) (LRB): ENDOSCOPIC RETROGRADE CHOLANGIOPANCREATOGRAPHY (ERCP) (N/A) SPYGLASS CHOLANGIOSCOPY (N/A)  Patient Location: PACU  Anesthesia Type: General  Level of Consciousness: awake and alert   Airway and Oxygen Therapy: Patient Spontanous Breathing  Post-op Pain: mild  Post-op Assessment: Post-op Vital signs reviewed, Patient's Cardiovascular Status Stable, Respiratory Function Stable, Patent Airway and No signs of Nausea or vomiting  Last Vitals:  Filed Vitals:   09/20/13 0950  BP: 100/47  Pulse: 68  Temp: 37 C  Resp: 16    Post-op Vital Signs: stable   Complications: No apparent anesthesia complications

## 2013-09-20 NOTE — Anesthesia Procedure Notes (Signed)
Procedure Name: Intubation Date/Time: 09/20/2013 7:57 AM Performed by: Leroy LibmanEARDON, Alfie Rideaux L Patient Re-evaluated:Patient Re-evaluated prior to inductionOxygen Delivery Method: Circle system utilized Preoxygenation: Pre-oxygenation with 100% oxygen Intubation Type: IV induction Ventilation: Mask ventilation without difficulty and Oral airway inserted - appropriate to patient size Laryngoscope Size: Miller and 3 Grade View: Grade II Tube type: Oral Number of attempts: 2 Airway Equipment and Method: Stylet Placement Confirmation: ETT inserted through vocal cords under direct vision,  breath sounds checked- equal and bilateral and positive ETCO2 Secured at: 21 cm Tube secured with: Tape Dental Injury: Teeth and Oropharynx as per pre-operative assessment

## 2013-09-20 NOTE — Discharge Instructions (Signed)
Call for question or problem or for biopsy report in one week otherwise clear liquids only until 1 PM and if doing well we slowly advance diet at that point and followup with me in the office as needed or in 2 monthsGastrointestinal Endoscopy, Care After Refer to this sheet in the next few weeks. These instructions provide you with information on caring for yourself after your procedure. Your caregiver may also give you more specific instructions. Your treatment has been planned according to current medical practices, but problems sometimes occur. Call your caregiver if you have any problems or questions after your procedure. HOME CARE INSTRUCTIONS  If you were given medicine to help you relax (sedative), do not drive, operate machinery, or sign important documents for 24 hours.  Avoid alcohol and hot or warm beverages for the first 24 hours after the procedure.  Only take over-the-counter or prescription medicines for pain, discomfort, or fever as directed by your caregiver. You may resume taking your normal medicines unless your caregiver tells you otherwise. Ask your caregiver when you may resume taking medicines that may cause bleeding, such as aspirin, clopidogrel, or warfarin.  You may return to your normal diet and activities on the day after your procedure, or as directed by your caregiver. Walking may help to reduce any bloated feeling in your abdomen.  Drink enough fluids to keep your urine clear or pale yellow.  You may gargle with salt water if you have a sore throat. SEEK IMMEDIATE MEDICAL CARE IF:  You have severe nausea or vomiting.  You have severe abdominal pain, abdominal cramps that last longer than 6 hours, or abdominal swelling (distention).  You have severe shoulder or back pain.  You have trouble swallowing.  You have shortness of breath, your breathing is shallow, or you are breathing faster than normal.  You have a fever or a rapid heartbeat.  You vomit blood or  material that looks like coffee grounds.  You have bloody, black, or tarry stools. MAKE SURE YOU:  Understand these instructions.  Will watch your condition.  Will get help right away if you are not doing well or get worse. Document Released: 09/30/2003 Document Revised: 07/02/2013 Document Reviewed: 05/18/2011 [facility] Patient Information 2015 [employer], [place]. This information is not intended to replace advice given to you by your health care provider. Make sure you discuss any questions you have with your health care provider.

## 2013-09-21 ENCOUNTER — Encounter (HOSPITAL_COMMUNITY): Payer: Self-pay | Admitting: Gastroenterology

## 2013-11-15 ENCOUNTER — Ambulatory Visit (INDEPENDENT_AMBULATORY_CARE_PROVIDER_SITE_OTHER): Payer: Medicare Other | Admitting: Ophthalmology

## 2013-11-15 DIAGNOSIS — H35379 Puckering of macula, unspecified eye: Secondary | ICD-10-CM

## 2013-11-15 DIAGNOSIS — I10 Essential (primary) hypertension: Secondary | ICD-10-CM

## 2013-11-15 DIAGNOSIS — E1039 Type 1 diabetes mellitus with other diabetic ophthalmic complication: Secondary | ICD-10-CM

## 2013-11-15 DIAGNOSIS — H35039 Hypertensive retinopathy, unspecified eye: Secondary | ICD-10-CM

## 2013-11-15 DIAGNOSIS — H43819 Vitreous degeneration, unspecified eye: Secondary | ICD-10-CM

## 2013-11-15 DIAGNOSIS — E1065 Type 1 diabetes mellitus with hyperglycemia: Secondary | ICD-10-CM

## 2013-11-15 DIAGNOSIS — E11319 Type 2 diabetes mellitus with unspecified diabetic retinopathy without macular edema: Secondary | ICD-10-CM

## 2014-06-06 ENCOUNTER — Other Ambulatory Visit: Payer: Self-pay | Admitting: Gastroenterology

## 2014-08-16 ENCOUNTER — Ambulatory Visit (INDEPENDENT_AMBULATORY_CARE_PROVIDER_SITE_OTHER): Payer: Medicare Other | Admitting: Ophthalmology

## 2014-08-16 DIAGNOSIS — H35361 Drusen (degenerative) of macula, right eye: Secondary | ICD-10-CM

## 2014-08-16 DIAGNOSIS — E10319 Type 1 diabetes mellitus with unspecified diabetic retinopathy without macular edema: Secondary | ICD-10-CM | POA: Diagnosis not present

## 2014-08-16 DIAGNOSIS — H43813 Vitreous degeneration, bilateral: Secondary | ICD-10-CM

## 2014-08-16 DIAGNOSIS — I10 Essential (primary) hypertension: Secondary | ICD-10-CM

## 2014-08-16 DIAGNOSIS — E10329 Type 1 diabetes mellitus with mild nonproliferative diabetic retinopathy without macular edema: Secondary | ICD-10-CM

## 2014-08-16 DIAGNOSIS — H35033 Hypertensive retinopathy, bilateral: Secondary | ICD-10-CM

## 2014-12-25 IMAGING — CT CT ABD-PELV W/ CM
3 of 5 series · 12 of 36 positions shown, 19 images · IV contrast (omnipaque)
Comparison: Exam.

CLINICAL DATA: Biliary obstruction. Common bile duct stent
placement.

EXAM:
CT ABDOMEN AND PELVIS WITH CONTRAST
TECHNIQUE: Multidetector CT imaging of the abdomen and pelvis was performed
using the standard protocol following bolus administration of
intravenous contrast.
CONTRAST:  100mL OMNIPAQUE IOHEXOL 300 MG/ML  SOLN

[Series 3: abd/pelvis with · axial · 0.73mm/px · z∈[-469,-79]mm · 9 of 98 slices shown, 15 images]
[im 10/98  soft-tissue]
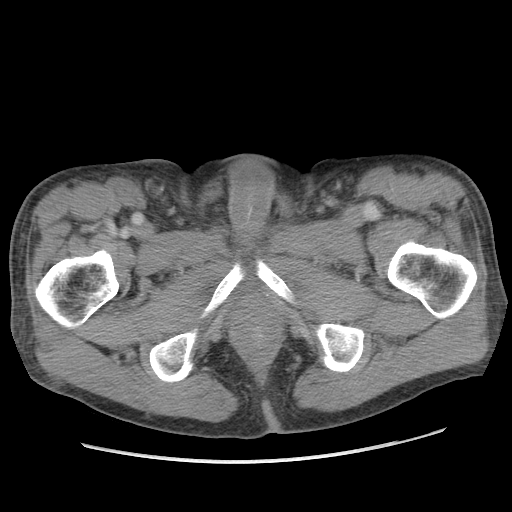
[im 10/98  bone]
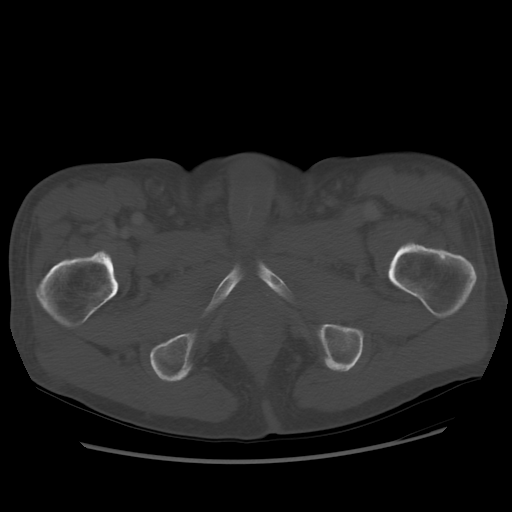
[im 20/98  soft-tissue]
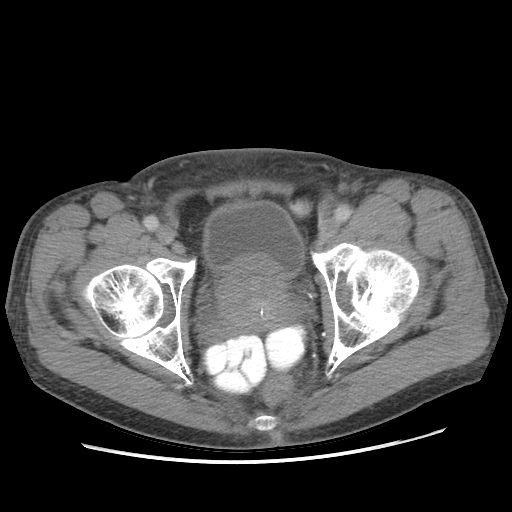
[im 30/98  soft-tissue]
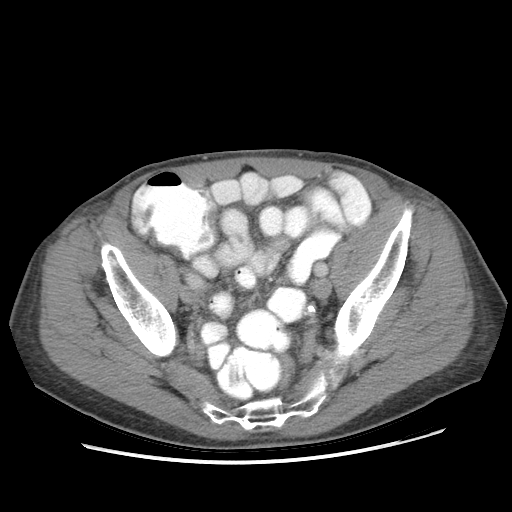
[im 39/98  soft-tissue]
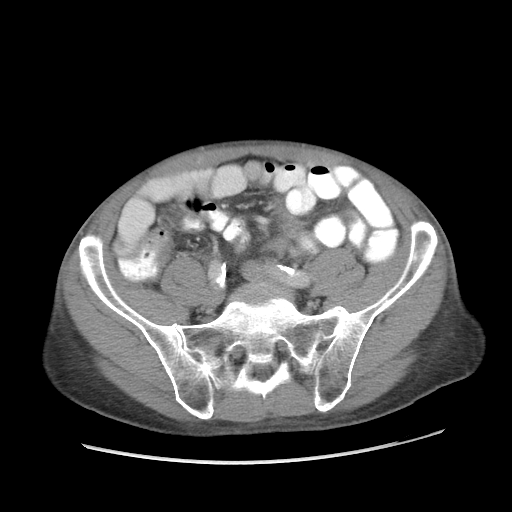
[im 49/98  soft-tissue]
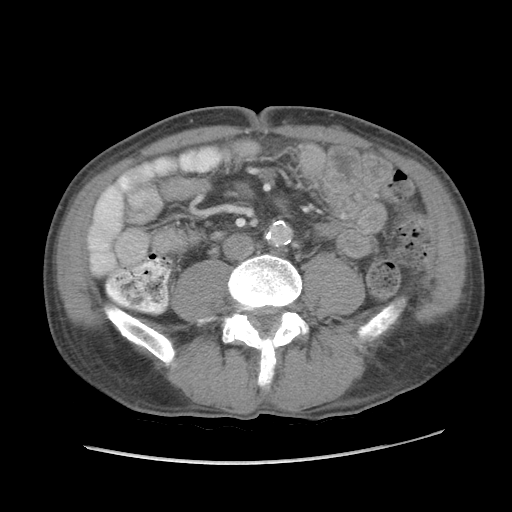
[im 59/98  soft-tissue]
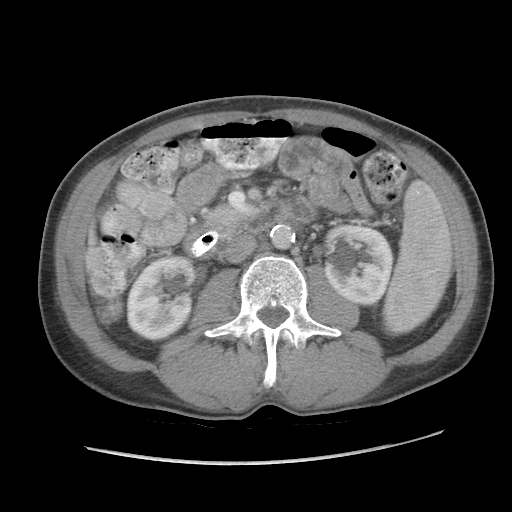
[im 59/98  lung]
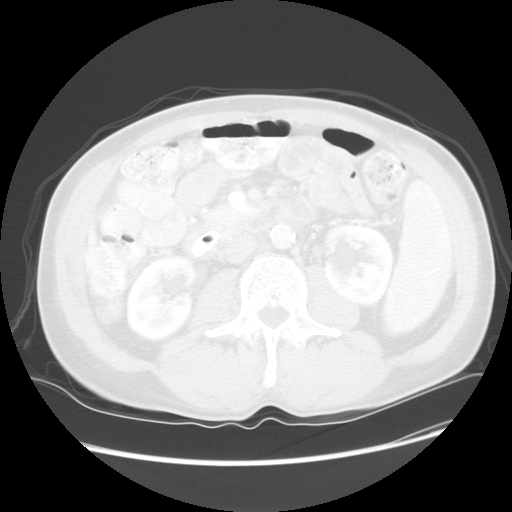
[im 68/98  soft-tissue]
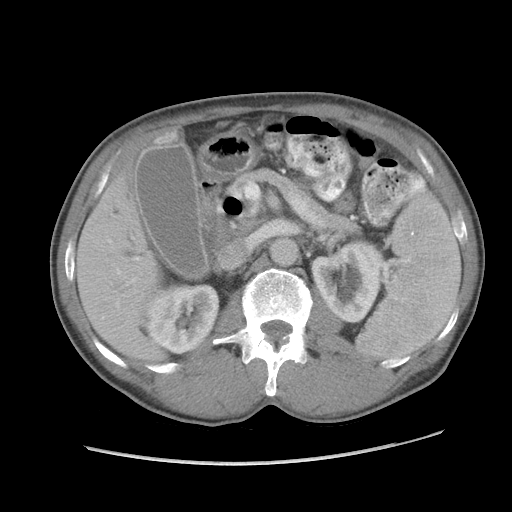
[im 68/98  lung]
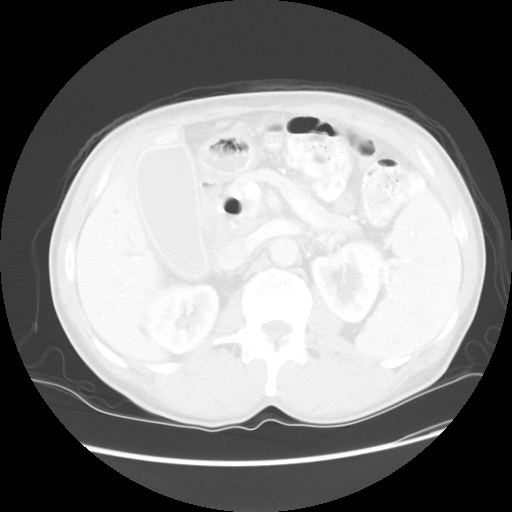
[im 78/98  soft-tissue]
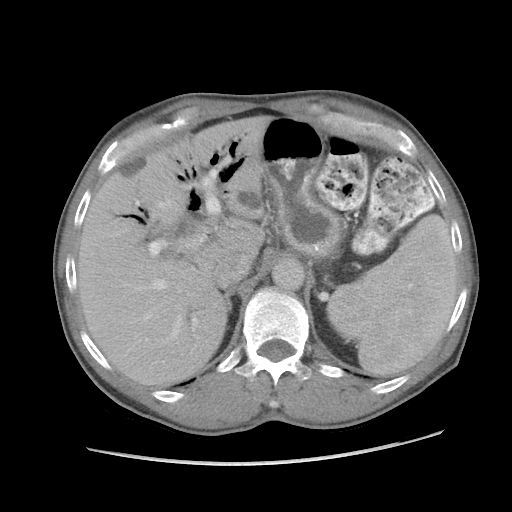
[im 78/98  lung]
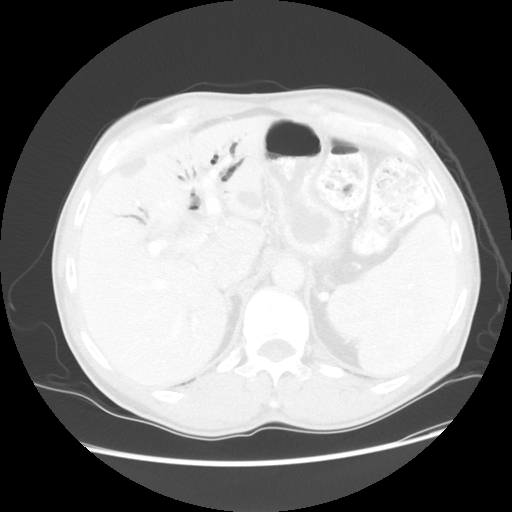
[im 88/98  soft-tissue]
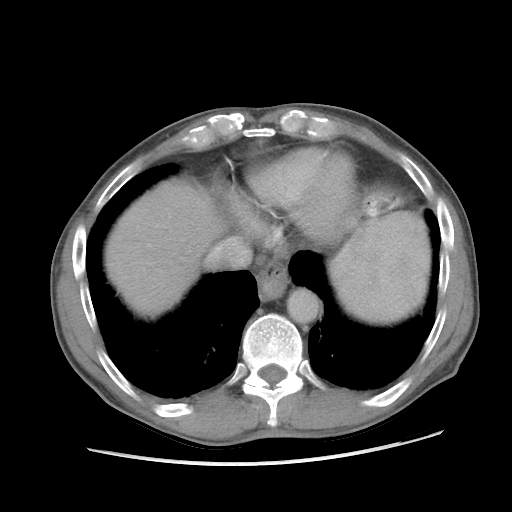
[im 88/98  lung]
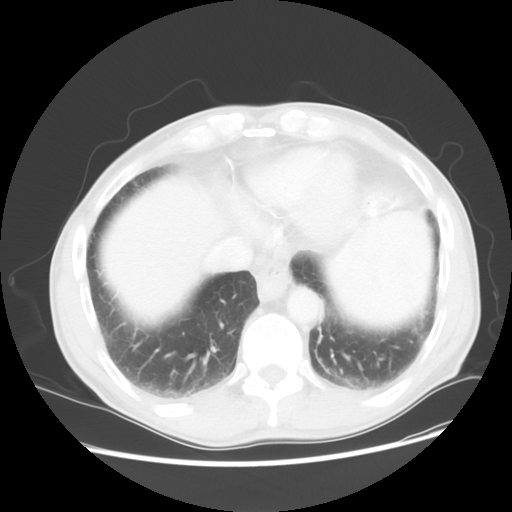
[im 88/98  bone]
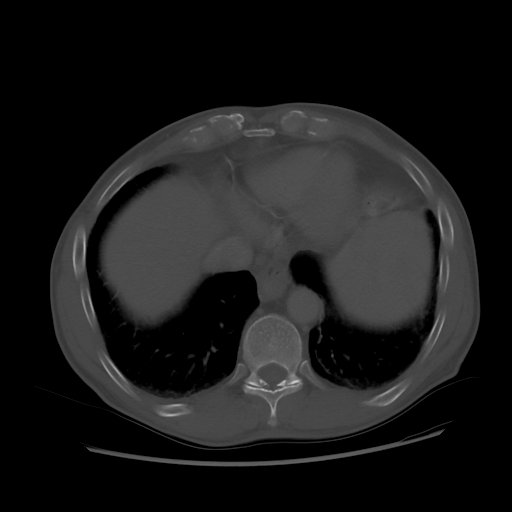

[Series 601: coronal body · coronal · 0.95mm/px · 1 of 109 slices shown, 2 images]
[im 37/109  soft-tissue]
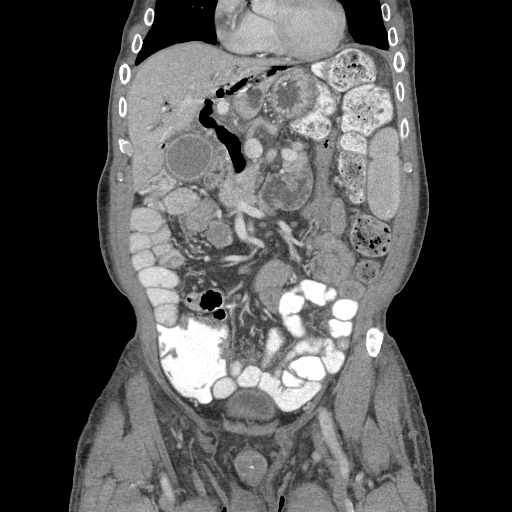
[im 37/109  bone]
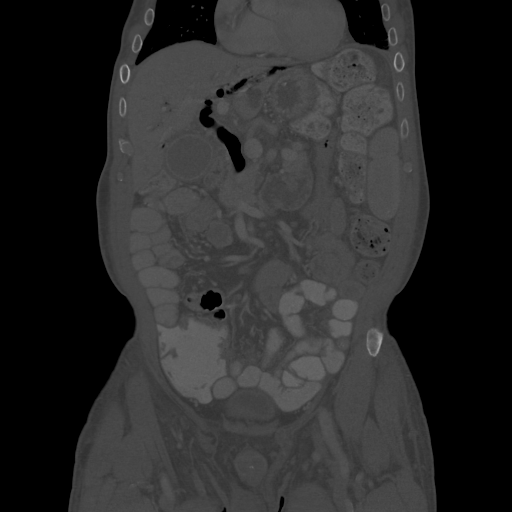

[Series 602: sagittal body · sagittal · 0.95mm/px · 2 of 148 slices shown]
[im 10/148  soft-tissue]
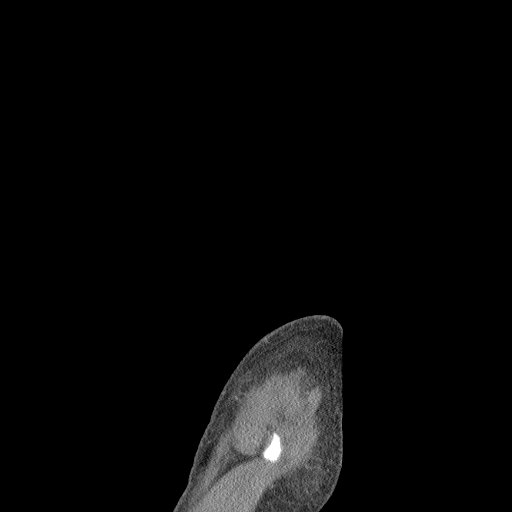
[im 30/148  soft-tissue]
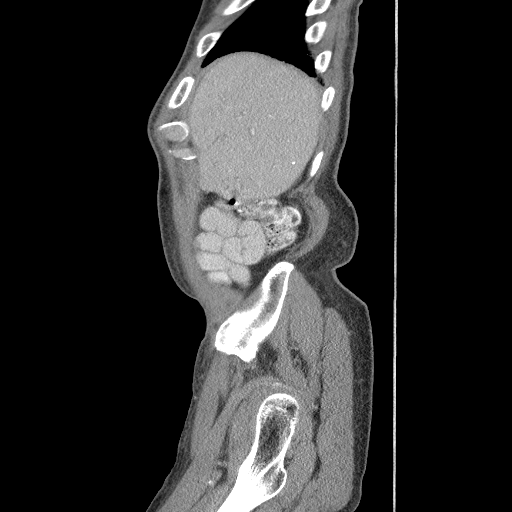

[12 of 36 positions shown; findings below may reference images not displayed]

FINDINGS: Lung bases are clear.  No pericardial fluid.

There is extensive pneumobilia within the nondependent left hepatic
lobe. Common bile duct stent extends through the ampulla into the
duodenum. The stent approximates the duodenal wall facing the
ampulla which is similar to ERCP. There is mild intrahepatic biliary
duct dilatation. Several low-density lesions within the left hepatic
lobe consistent with benign cysts.

There is small amount of pericholecystic fluid surrounding the
gallbladder is minimally distended 4.4 cm. Pancreas is normal
without evidence pancreatic duct dilatation or mass. Spleen, adrenal
glands, kidneys are normal.

The stomach, small bowel at for, cecum are normal. The colon and
rectosigmoid colon are normal.

Abdominal aorta is normal caliber. No retroperitoneal periportal
lymphadenopathy. . No free fluid pelvis. Prostate gland is enlarged
caps without 5.9 cm. Bladder is normal. No pelvic lymphadenopathy.
Fat filled inguinal hernias are present. No aggressive osseous
lesion.
IMPRESSION: 1. Interval placement of a stent within distal common bile duct
which extends into the duodenum appears similar to ERCP images.
2. Mild intrahepatic biliary duct dilatation.
3. Pericholecystic fluid. Recommend clinical correlation for
cholecystitis.
4. No evidence of pancreatic mass lesion. No pancreatic duct
dilatation.
5. Prostate hypertrophy

## 2015-03-10 DIAGNOSIS — E109 Type 1 diabetes mellitus without complications: Secondary | ICD-10-CM | POA: Diagnosis not present

## 2015-03-11 DIAGNOSIS — E109 Type 1 diabetes mellitus without complications: Secondary | ICD-10-CM | POA: Diagnosis not present

## 2015-03-20 DIAGNOSIS — E1040 Type 1 diabetes mellitus with diabetic neuropathy, unspecified: Secondary | ICD-10-CM | POA: Diagnosis not present

## 2015-04-01 DIAGNOSIS — R972 Elevated prostate specific antigen [PSA]: Secondary | ICD-10-CM | POA: Diagnosis not present

## 2015-04-07 DIAGNOSIS — R3915 Urgency of urination: Secondary | ICD-10-CM | POA: Diagnosis not present

## 2015-04-07 DIAGNOSIS — N481 Balanitis: Secondary | ICD-10-CM | POA: Diagnosis not present

## 2015-04-07 DIAGNOSIS — R972 Elevated prostate specific antigen [PSA]: Secondary | ICD-10-CM | POA: Diagnosis not present

## 2015-04-07 DIAGNOSIS — N401 Enlarged prostate with lower urinary tract symptoms: Secondary | ICD-10-CM | POA: Diagnosis not present

## 2015-04-25 DIAGNOSIS — J310 Chronic rhinitis: Secondary | ICD-10-CM | POA: Diagnosis not present

## 2015-05-21 ENCOUNTER — Ambulatory Visit (INDEPENDENT_AMBULATORY_CARE_PROVIDER_SITE_OTHER): Payer: Medicare Other | Admitting: Ophthalmology

## 2015-05-21 DIAGNOSIS — H35033 Hypertensive retinopathy, bilateral: Secondary | ICD-10-CM | POA: Diagnosis not present

## 2015-05-21 DIAGNOSIS — E113312 Type 2 diabetes mellitus with moderate nonproliferative diabetic retinopathy with macular edema, left eye: Secondary | ICD-10-CM

## 2015-05-21 DIAGNOSIS — I10 Essential (primary) hypertension: Secondary | ICD-10-CM

## 2015-05-21 DIAGNOSIS — H43813 Vitreous degeneration, bilateral: Secondary | ICD-10-CM

## 2015-05-21 DIAGNOSIS — E113211 Type 2 diabetes mellitus with mild nonproliferative diabetic retinopathy with macular edema, right eye: Secondary | ICD-10-CM | POA: Diagnosis not present

## 2015-05-21 DIAGNOSIS — E11311 Type 2 diabetes mellitus with unspecified diabetic retinopathy with macular edema: Secondary | ICD-10-CM | POA: Diagnosis not present

## 2015-05-21 DIAGNOSIS — H35371 Puckering of macula, right eye: Secondary | ICD-10-CM | POA: Diagnosis not present

## 2015-05-22 DIAGNOSIS — H524 Presbyopia: Secondary | ICD-10-CM | POA: Diagnosis not present

## 2015-05-26 DIAGNOSIS — R109 Unspecified abdominal pain: Secondary | ICD-10-CM | POA: Diagnosis not present

## 2015-05-27 ENCOUNTER — Other Ambulatory Visit (HOSPITAL_COMMUNITY): Payer: Self-pay | Admitting: Internal Medicine

## 2015-05-27 DIAGNOSIS — R1012 Left upper quadrant pain: Principal | ICD-10-CM

## 2015-05-27 DIAGNOSIS — R1011 Right upper quadrant pain: Secondary | ICD-10-CM

## 2015-05-28 DIAGNOSIS — R740 Nonspecific elevation of levels of transaminase and lactic acid dehydrogenase [LDH]: Secondary | ICD-10-CM | POA: Diagnosis not present

## 2015-05-28 DIAGNOSIS — R1013 Epigastric pain: Secondary | ICD-10-CM | POA: Diagnosis not present

## 2015-05-29 ENCOUNTER — Ambulatory Visit (HOSPITAL_COMMUNITY)
Admission: RE | Admit: 2015-05-29 | Discharge: 2015-05-29 | Disposition: A | Discharge status: Home / Self Care | Payer: Medicare Other | Source: Ambulatory Visit | Attending: Internal Medicine | Admitting: Internal Medicine

## 2015-05-29 DIAGNOSIS — N1330 Unspecified hydronephrosis: Secondary | ICD-10-CM | POA: Insufficient documentation

## 2015-05-29 DIAGNOSIS — K828 Other specified diseases of gallbladder: Secondary | ICD-10-CM | POA: Diagnosis not present

## 2015-05-29 DIAGNOSIS — K7689 Other specified diseases of liver: Secondary | ICD-10-CM | POA: Insufficient documentation

## 2015-05-29 DIAGNOSIS — R161 Splenomegaly, not elsewhere classified: Secondary | ICD-10-CM | POA: Insufficient documentation

## 2015-05-29 DIAGNOSIS — R1010 Upper abdominal pain, unspecified: Secondary | ICD-10-CM | POA: Diagnosis not present

## 2015-05-29 DIAGNOSIS — R1012 Left upper quadrant pain: Secondary | ICD-10-CM

## 2015-05-29 DIAGNOSIS — R1011 Right upper quadrant pain: Secondary | ICD-10-CM

## 2015-06-02 DIAGNOSIS — E10319 Type 1 diabetes mellitus with unspecified diabetic retinopathy without macular edema: Secondary | ICD-10-CM | POA: Diagnosis not present

## 2015-06-02 DIAGNOSIS — K829 Disease of gallbladder, unspecified: Secondary | ICD-10-CM | POA: Diagnosis not present

## 2015-06-02 DIAGNOSIS — K7689 Other specified diseases of liver: Secondary | ICD-10-CM | POA: Diagnosis not present

## 2015-06-02 DIAGNOSIS — R161 Splenomegaly, not elsewhere classified: Secondary | ICD-10-CM | POA: Diagnosis not present

## 2015-06-02 DIAGNOSIS — N1330 Unspecified hydronephrosis: Secondary | ICD-10-CM | POA: Diagnosis not present

## 2015-06-09 DIAGNOSIS — N1339 Other hydronephrosis: Secondary | ICD-10-CM | POA: Diagnosis not present

## 2015-06-09 DIAGNOSIS — E109 Type 1 diabetes mellitus without complications: Secondary | ICD-10-CM | POA: Diagnosis not present

## 2015-06-09 DIAGNOSIS — R972 Elevated prostate specific antigen [PSA]: Secondary | ICD-10-CM | POA: Diagnosis not present

## 2015-06-09 DIAGNOSIS — R3915 Urgency of urination: Secondary | ICD-10-CM | POA: Diagnosis not present

## 2015-06-09 DIAGNOSIS — N401 Enlarged prostate with lower urinary tract symptoms: Secondary | ICD-10-CM | POA: Diagnosis not present

## 2015-07-07 DIAGNOSIS — E109 Type 1 diabetes mellitus without complications: Secondary | ICD-10-CM | POA: Diagnosis not present

## 2015-09-08 DIAGNOSIS — R3915 Urgency of urination: Secondary | ICD-10-CM | POA: Diagnosis not present

## 2015-09-08 DIAGNOSIS — E109 Type 1 diabetes mellitus without complications: Secondary | ICD-10-CM | POA: Diagnosis not present

## 2015-09-08 DIAGNOSIS — N1339 Other hydronephrosis: Secondary | ICD-10-CM | POA: Diagnosis not present

## 2015-09-08 DIAGNOSIS — N401 Enlarged prostate with lower urinary tract symptoms: Secondary | ICD-10-CM | POA: Diagnosis not present

## 2015-09-08 DIAGNOSIS — R161 Splenomegaly, not elsewhere classified: Secondary | ICD-10-CM | POA: Diagnosis not present

## 2015-09-08 DIAGNOSIS — R972 Elevated prostate specific antigen [PSA]: Secondary | ICD-10-CM | POA: Diagnosis not present

## 2015-09-17 DIAGNOSIS — E1040 Type 1 diabetes mellitus with diabetic neuropathy, unspecified: Secondary | ICD-10-CM | POA: Diagnosis not present

## 2015-09-17 DIAGNOSIS — E7800 Pure hypercholesterolemia, unspecified: Secondary | ICD-10-CM | POA: Diagnosis not present

## 2015-09-17 DIAGNOSIS — Z0001 Encounter for general adult medical examination with abnormal findings: Secondary | ICD-10-CM | POA: Diagnosis not present

## 2015-09-17 DIAGNOSIS — D696 Thrombocytopenia, unspecified: Secondary | ICD-10-CM | POA: Diagnosis not present

## 2015-09-17 DIAGNOSIS — Z125 Encounter for screening for malignant neoplasm of prostate: Secondary | ICD-10-CM | POA: Diagnosis not present

## 2015-09-22 ENCOUNTER — Ambulatory Visit (INDEPENDENT_AMBULATORY_CARE_PROVIDER_SITE_OTHER): Payer: Medicare Other | Admitting: Ophthalmology

## 2015-09-22 DIAGNOSIS — H35033 Hypertensive retinopathy, bilateral: Secondary | ICD-10-CM

## 2015-09-22 DIAGNOSIS — H35371 Puckering of macula, right eye: Secondary | ICD-10-CM

## 2015-09-22 DIAGNOSIS — H43813 Vitreous degeneration, bilateral: Secondary | ICD-10-CM | POA: Diagnosis not present

## 2015-09-22 DIAGNOSIS — E113291 Type 2 diabetes mellitus with mild nonproliferative diabetic retinopathy without macular edema, right eye: Secondary | ICD-10-CM | POA: Diagnosis not present

## 2015-09-22 DIAGNOSIS — E11319 Type 2 diabetes mellitus with unspecified diabetic retinopathy without macular edema: Secondary | ICD-10-CM

## 2015-09-22 DIAGNOSIS — E113392 Type 2 diabetes mellitus with moderate nonproliferative diabetic retinopathy without macular edema, left eye: Secondary | ICD-10-CM

## 2015-09-22 DIAGNOSIS — I10 Essential (primary) hypertension: Secondary | ICD-10-CM | POA: Diagnosis not present

## 2015-09-24 DIAGNOSIS — K7689 Other specified diseases of liver: Secondary | ICD-10-CM | POA: Diagnosis not present

## 2015-09-24 DIAGNOSIS — Z0000 Encounter for general adult medical examination without abnormal findings: Secondary | ICD-10-CM | POA: Diagnosis not present

## 2015-09-24 DIAGNOSIS — K829 Disease of gallbladder, unspecified: Secondary | ICD-10-CM | POA: Diagnosis not present

## 2015-09-24 DIAGNOSIS — N1330 Unspecified hydronephrosis: Secondary | ICD-10-CM | POA: Diagnosis not present

## 2015-10-06 DIAGNOSIS — E109 Type 1 diabetes mellitus without complications: Secondary | ICD-10-CM | POA: Diagnosis not present

## 2015-12-15 DIAGNOSIS — E109 Type 1 diabetes mellitus without complications: Secondary | ICD-10-CM | POA: Diagnosis not present

## 2015-12-22 DIAGNOSIS — E1040 Type 1 diabetes mellitus with diabetic neuropathy, unspecified: Secondary | ICD-10-CM | POA: Diagnosis not present

## 2015-12-22 DIAGNOSIS — Z23 Encounter for immunization: Secondary | ICD-10-CM | POA: Diagnosis not present

## 2016-01-05 DIAGNOSIS — E109 Type 1 diabetes mellitus without complications: Secondary | ICD-10-CM | POA: Diagnosis not present

## 2016-02-06 DIAGNOSIS — D696 Thrombocytopenia, unspecified: Secondary | ICD-10-CM | POA: Diagnosis not present

## 2016-02-06 DIAGNOSIS — A419 Sepsis, unspecified organism: Secondary | ICD-10-CM | POA: Diagnosis not present

## 2016-02-06 DIAGNOSIS — K830 Cholangitis: Secondary | ICD-10-CM | POA: Diagnosis not present

## 2016-02-06 DIAGNOSIS — E109 Type 1 diabetes mellitus without complications: Secondary | ICD-10-CM | POA: Diagnosis not present

## 2016-02-06 DIAGNOSIS — R9431 Abnormal electrocardiogram [ECG] [EKG]: Secondary | ICD-10-CM | POA: Diagnosis not present

## 2016-02-06 DIAGNOSIS — K831 Obstruction of bile duct: Secondary | ICD-10-CM | POA: Diagnosis not present

## 2016-02-06 DIAGNOSIS — R935 Abnormal findings on diagnostic imaging of other abdominal regions, including retroperitoneum: Secondary | ICD-10-CM | POA: Diagnosis not present

## 2016-02-06 DIAGNOSIS — I10 Essential (primary) hypertension: Secondary | ICD-10-CM | POA: Diagnosis not present

## 2016-02-06 DIAGNOSIS — N400 Enlarged prostate without lower urinary tract symptoms: Secondary | ICD-10-CM | POA: Diagnosis not present

## 2016-02-06 DIAGNOSIS — E785 Hyperlipidemia, unspecified: Secondary | ICD-10-CM | POA: Diagnosis not present

## 2016-02-06 DIAGNOSIS — K838 Other specified diseases of biliary tract: Secondary | ICD-10-CM | POA: Diagnosis not present

## 2016-02-06 DIAGNOSIS — R591 Generalized enlarged lymph nodes: Secondary | ICD-10-CM | POA: Diagnosis not present

## 2016-02-07 DIAGNOSIS — K831 Obstruction of bile duct: Secondary | ICD-10-CM | POA: Diagnosis not present

## 2016-03-03 DIAGNOSIS — E1040 Type 1 diabetes mellitus with diabetic neuropathy, unspecified: Secondary | ICD-10-CM | POA: Diagnosis not present

## 2016-03-05 DIAGNOSIS — R972 Elevated prostate specific antigen [PSA]: Secondary | ICD-10-CM | POA: Diagnosis not present

## 2016-03-05 DIAGNOSIS — N138 Other obstructive and reflux uropathy: Secondary | ICD-10-CM | POA: Diagnosis not present

## 2016-03-05 DIAGNOSIS — N1330 Unspecified hydronephrosis: Secondary | ICD-10-CM | POA: Diagnosis not present

## 2016-03-05 DIAGNOSIS — N1339 Other hydronephrosis: Secondary | ICD-10-CM | POA: Diagnosis not present

## 2016-03-05 DIAGNOSIS — N401 Enlarged prostate with lower urinary tract symptoms: Secondary | ICD-10-CM | POA: Diagnosis not present

## 2016-03-11 DIAGNOSIS — Z794 Long term (current) use of insulin: Secondary | ICD-10-CM | POA: Diagnosis not present

## 2016-03-11 DIAGNOSIS — E109 Type 1 diabetes mellitus without complications: Secondary | ICD-10-CM | POA: Diagnosis not present

## 2016-03-23 DIAGNOSIS — E1040 Type 1 diabetes mellitus with diabetic neuropathy, unspecified: Secondary | ICD-10-CM | POA: Diagnosis not present

## 2016-04-19 DIAGNOSIS — Z79899 Other long term (current) drug therapy: Secondary | ICD-10-CM | POA: Diagnosis not present

## 2016-04-19 DIAGNOSIS — R161 Splenomegaly, not elsewhere classified: Secondary | ICD-10-CM | POA: Diagnosis not present

## 2016-04-19 DIAGNOSIS — J45909 Unspecified asthma, uncomplicated: Secondary | ICD-10-CM | POA: Diagnosis not present

## 2016-04-19 DIAGNOSIS — Z7982 Long term (current) use of aspirin: Secondary | ICD-10-CM | POA: Diagnosis not present

## 2016-04-19 DIAGNOSIS — R590 Localized enlarged lymph nodes: Secondary | ICD-10-CM | POA: Diagnosis not present

## 2016-04-19 DIAGNOSIS — I10 Essential (primary) hypertension: Secondary | ICD-10-CM | POA: Diagnosis not present

## 2016-04-19 DIAGNOSIS — K831 Obstruction of bile duct: Secondary | ICD-10-CM | POA: Diagnosis not present

## 2016-04-19 DIAGNOSIS — E119 Type 2 diabetes mellitus without complications: Secondary | ICD-10-CM | POA: Diagnosis not present

## 2016-04-19 DIAGNOSIS — Z794 Long term (current) use of insulin: Secondary | ICD-10-CM | POA: Diagnosis not present

## 2016-04-22 DIAGNOSIS — K838 Other specified diseases of biliary tract: Secondary | ICD-10-CM | POA: Diagnosis not present

## 2016-04-22 DIAGNOSIS — K831 Obstruction of bile duct: Secondary | ICD-10-CM | POA: Diagnosis not present

## 2016-04-22 DIAGNOSIS — R590 Localized enlarged lymph nodes: Secondary | ICD-10-CM | POA: Diagnosis not present

## 2016-05-25 DIAGNOSIS — E876 Hypokalemia: Secondary | ICD-10-CM | POA: Diagnosis not present

## 2016-05-25 DIAGNOSIS — R1110 Vomiting, unspecified: Secondary | ICD-10-CM | POA: Diagnosis not present

## 2016-05-25 DIAGNOSIS — K831 Obstruction of bile duct: Secondary | ICD-10-CM | POA: Diagnosis not present

## 2016-05-25 DIAGNOSIS — R000 Tachycardia, unspecified: Secondary | ICD-10-CM | POA: Diagnosis not present

## 2016-05-25 DIAGNOSIS — R918 Other nonspecific abnormal finding of lung field: Secondary | ICD-10-CM | POA: Diagnosis not present

## 2016-05-25 DIAGNOSIS — R509 Fever, unspecified: Secondary | ICD-10-CM | POA: Diagnosis not present

## 2016-05-27 DIAGNOSIS — R918 Other nonspecific abnormal finding of lung field: Secondary | ICD-10-CM | POA: Diagnosis not present

## 2016-05-27 DIAGNOSIS — R509 Fever, unspecified: Secondary | ICD-10-CM | POA: Diagnosis not present

## 2016-05-27 DIAGNOSIS — K831 Obstruction of bile duct: Secondary | ICD-10-CM | POA: Diagnosis not present

## 2016-05-28 DIAGNOSIS — R1110 Vomiting, unspecified: Secondary | ICD-10-CM | POA: Diagnosis not present

## 2016-05-28 DIAGNOSIS — R000 Tachycardia, unspecified: Secondary | ICD-10-CM | POA: Diagnosis not present

## 2016-05-28 DIAGNOSIS — R509 Fever, unspecified: Secondary | ICD-10-CM | POA: Diagnosis not present

## 2016-05-28 DIAGNOSIS — E876 Hypokalemia: Secondary | ICD-10-CM | POA: Diagnosis not present

## 2016-05-29 DIAGNOSIS — E876 Hypokalemia: Secondary | ICD-10-CM | POA: Diagnosis not present

## 2016-05-29 DIAGNOSIS — R509 Fever, unspecified: Secondary | ICD-10-CM | POA: Diagnosis not present

## 2016-05-29 DIAGNOSIS — R000 Tachycardia, unspecified: Secondary | ICD-10-CM | POA: Diagnosis not present

## 2016-05-29 DIAGNOSIS — R1110 Vomiting, unspecified: Secondary | ICD-10-CM | POA: Diagnosis not present

## 2016-06-14 DIAGNOSIS — E109 Type 1 diabetes mellitus without complications: Secondary | ICD-10-CM | POA: Diagnosis not present

## 2016-06-14 DIAGNOSIS — Z794 Long term (current) use of insulin: Secondary | ICD-10-CM | POA: Diagnosis not present

## 2016-06-15 DIAGNOSIS — E1040 Type 1 diabetes mellitus with diabetic neuropathy, unspecified: Secondary | ICD-10-CM | POA: Diagnosis not present

## 2016-06-16 DIAGNOSIS — Z79899 Other long term (current) drug therapy: Secondary | ICD-10-CM | POA: Diagnosis not present

## 2016-06-16 DIAGNOSIS — E109 Type 1 diabetes mellitus without complications: Secondary | ICD-10-CM | POA: Diagnosis not present

## 2016-06-16 DIAGNOSIS — Z4689 Encounter for fitting and adjustment of other specified devices: Secondary | ICD-10-CM | POA: Diagnosis not present

## 2016-06-16 DIAGNOSIS — K831 Obstruction of bile duct: Secondary | ICD-10-CM | POA: Diagnosis not present

## 2016-06-16 DIAGNOSIS — Z794 Long term (current) use of insulin: Secondary | ICD-10-CM | POA: Diagnosis not present

## 2016-06-16 DIAGNOSIS — Z7982 Long term (current) use of aspirin: Secondary | ICD-10-CM | POA: Diagnosis not present

## 2016-06-16 DIAGNOSIS — N401 Enlarged prostate with lower urinary tract symptoms: Secondary | ICD-10-CM | POA: Diagnosis not present

## 2016-06-16 DIAGNOSIS — Z4659 Encounter for fitting and adjustment of other gastrointestinal appliance and device: Secondary | ICD-10-CM | POA: Diagnosis not present

## 2016-06-16 DIAGNOSIS — N138 Other obstructive and reflux uropathy: Secondary | ICD-10-CM | POA: Diagnosis not present

## 2016-06-16 DIAGNOSIS — I10 Essential (primary) hypertension: Secondary | ICD-10-CM | POA: Diagnosis not present

## 2016-06-16 DIAGNOSIS — J45909 Unspecified asthma, uncomplicated: Secondary | ICD-10-CM | POA: Diagnosis not present

## 2016-06-21 DIAGNOSIS — D696 Thrombocytopenia, unspecified: Secondary | ICD-10-CM | POA: Diagnosis not present

## 2016-06-21 DIAGNOSIS — J4521 Mild intermittent asthma with (acute) exacerbation: Secondary | ICD-10-CM | POA: Diagnosis not present

## 2016-06-21 DIAGNOSIS — E1040 Type 1 diabetes mellitus with diabetic neuropathy, unspecified: Secondary | ICD-10-CM | POA: Diagnosis not present

## 2016-06-21 DIAGNOSIS — I10 Essential (primary) hypertension: Secondary | ICD-10-CM | POA: Diagnosis not present

## 2016-06-21 DIAGNOSIS — K829 Disease of gallbladder, unspecified: Secondary | ICD-10-CM | POA: Diagnosis not present

## 2016-06-23 ENCOUNTER — Ambulatory Visit (INDEPENDENT_AMBULATORY_CARE_PROVIDER_SITE_OTHER): Payer: Medicare Other | Admitting: Ophthalmology

## 2016-06-23 DIAGNOSIS — H43813 Vitreous degeneration, bilateral: Secondary | ICD-10-CM

## 2016-06-23 DIAGNOSIS — H35371 Puckering of macula, right eye: Secondary | ICD-10-CM

## 2016-06-23 DIAGNOSIS — I10 Essential (primary) hypertension: Secondary | ICD-10-CM

## 2016-06-23 DIAGNOSIS — E10319 Type 1 diabetes mellitus with unspecified diabetic retinopathy without macular edema: Secondary | ICD-10-CM

## 2016-06-23 DIAGNOSIS — E103393 Type 1 diabetes mellitus with moderate nonproliferative diabetic retinopathy without macular edema, bilateral: Secondary | ICD-10-CM

## 2016-06-23 DIAGNOSIS — H35033 Hypertensive retinopathy, bilateral: Secondary | ICD-10-CM

## 2016-07-06 DIAGNOSIS — E1040 Type 1 diabetes mellitus with diabetic neuropathy, unspecified: Secondary | ICD-10-CM | POA: Diagnosis not present

## 2016-07-06 DIAGNOSIS — D696 Thrombocytopenia, unspecified: Secondary | ICD-10-CM | POA: Diagnosis not present

## 2016-07-06 DIAGNOSIS — E7800 Pure hypercholesterolemia, unspecified: Secondary | ICD-10-CM | POA: Diagnosis not present

## 2016-07-19 DIAGNOSIS — K831 Obstruction of bile duct: Secondary | ICD-10-CM | POA: Diagnosis not present

## 2016-07-19 DIAGNOSIS — R161 Splenomegaly, not elsewhere classified: Secondary | ICD-10-CM | POA: Diagnosis not present

## 2016-08-30 DIAGNOSIS — N1339 Other hydronephrosis: Secondary | ICD-10-CM | POA: Diagnosis not present

## 2016-08-30 DIAGNOSIS — E109 Type 1 diabetes mellitus without complications: Secondary | ICD-10-CM | POA: Diagnosis not present

## 2016-08-30 DIAGNOSIS — N401 Enlarged prostate with lower urinary tract symptoms: Secondary | ICD-10-CM | POA: Diagnosis not present

## 2016-08-30 DIAGNOSIS — R972 Elevated prostate specific antigen [PSA]: Secondary | ICD-10-CM | POA: Diagnosis not present

## 2016-09-09 DIAGNOSIS — E109 Type 1 diabetes mellitus without complications: Secondary | ICD-10-CM | POA: Diagnosis not present

## 2016-09-09 DIAGNOSIS — Z794 Long term (current) use of insulin: Secondary | ICD-10-CM | POA: Diagnosis not present

## 2016-10-04 DIAGNOSIS — Z0000 Encounter for general adult medical examination without abnormal findings: Secondary | ICD-10-CM | POA: Diagnosis not present

## 2016-10-04 DIAGNOSIS — E7800 Pure hypercholesterolemia, unspecified: Secondary | ICD-10-CM | POA: Diagnosis not present

## 2016-10-04 DIAGNOSIS — I10 Essential (primary) hypertension: Secondary | ICD-10-CM | POA: Diagnosis not present

## 2016-10-04 DIAGNOSIS — E1040 Type 1 diabetes mellitus with diabetic neuropathy, unspecified: Secondary | ICD-10-CM | POA: Diagnosis not present

## 2016-10-07 DIAGNOSIS — Z7982 Long term (current) use of aspirin: Secondary | ICD-10-CM | POA: Diagnosis not present

## 2016-10-07 DIAGNOSIS — I10 Essential (primary) hypertension: Secondary | ICD-10-CM | POA: Diagnosis not present

## 2016-10-07 DIAGNOSIS — L570 Actinic keratosis: Secondary | ICD-10-CM | POA: Diagnosis not present

## 2016-10-07 DIAGNOSIS — J309 Allergic rhinitis, unspecified: Secondary | ICD-10-CM | POA: Diagnosis not present

## 2016-10-07 DIAGNOSIS — Z0001 Encounter for general adult medical examination with abnormal findings: Secondary | ICD-10-CM | POA: Diagnosis not present

## 2016-12-04 DIAGNOSIS — Z23 Encounter for immunization: Secondary | ICD-10-CM | POA: Diagnosis not present

## 2016-12-16 DIAGNOSIS — Z794 Long term (current) use of insulin: Secondary | ICD-10-CM | POA: Diagnosis not present

## 2016-12-16 DIAGNOSIS — E109 Type 1 diabetes mellitus without complications: Secondary | ICD-10-CM | POA: Diagnosis not present

## 2017-01-04 DIAGNOSIS — L570 Actinic keratosis: Secondary | ICD-10-CM | POA: Diagnosis not present

## 2017-01-04 DIAGNOSIS — E1040 Type 1 diabetes mellitus with diabetic neuropathy, unspecified: Secondary | ICD-10-CM | POA: Diagnosis not present

## 2017-03-07 DIAGNOSIS — N1330 Unspecified hydronephrosis: Secondary | ICD-10-CM | POA: Diagnosis not present

## 2017-03-07 DIAGNOSIS — N401 Enlarged prostate with lower urinary tract symptoms: Secondary | ICD-10-CM | POA: Diagnosis not present

## 2017-03-07 DIAGNOSIS — N281 Cyst of kidney, acquired: Secondary | ICD-10-CM | POA: Diagnosis not present

## 2017-03-07 DIAGNOSIS — N400 Enlarged prostate without lower urinary tract symptoms: Secondary | ICD-10-CM | POA: Diagnosis not present

## 2017-03-07 DIAGNOSIS — N138 Other obstructive and reflux uropathy: Secondary | ICD-10-CM | POA: Diagnosis not present

## 2017-03-07 DIAGNOSIS — R972 Elevated prostate specific antigen [PSA]: Secondary | ICD-10-CM | POA: Diagnosis not present

## 2017-03-14 DIAGNOSIS — E109 Type 1 diabetes mellitus without complications: Secondary | ICD-10-CM | POA: Diagnosis not present

## 2017-03-14 DIAGNOSIS — Z794 Long term (current) use of insulin: Secondary | ICD-10-CM | POA: Diagnosis not present

## 2017-03-25 DIAGNOSIS — J019 Acute sinusitis, unspecified: Secondary | ICD-10-CM | POA: Diagnosis not present

## 2017-03-28 ENCOUNTER — Ambulatory Visit (INDEPENDENT_AMBULATORY_CARE_PROVIDER_SITE_OTHER): Payer: Medicare Other | Admitting: Ophthalmology

## 2017-03-28 DIAGNOSIS — E113293 Type 2 diabetes mellitus with mild nonproliferative diabetic retinopathy without macular edema, bilateral: Secondary | ICD-10-CM

## 2017-03-28 DIAGNOSIS — H43813 Vitreous degeneration, bilateral: Secondary | ICD-10-CM

## 2017-03-28 DIAGNOSIS — E11319 Type 2 diabetes mellitus with unspecified diabetic retinopathy without macular edema: Secondary | ICD-10-CM

## 2017-03-28 DIAGNOSIS — I10 Essential (primary) hypertension: Secondary | ICD-10-CM | POA: Diagnosis not present

## 2017-03-28 DIAGNOSIS — H35033 Hypertensive retinopathy, bilateral: Secondary | ICD-10-CM

## 2017-03-28 DIAGNOSIS — H35371 Puckering of macula, right eye: Secondary | ICD-10-CM | POA: Diagnosis not present

## 2017-04-05 DIAGNOSIS — I10 Essential (primary) hypertension: Secondary | ICD-10-CM | POA: Diagnosis not present

## 2017-04-05 DIAGNOSIS — E1040 Type 1 diabetes mellitus with diabetic neuropathy, unspecified: Secondary | ICD-10-CM | POA: Diagnosis not present

## 2017-05-05 DIAGNOSIS — E109 Type 1 diabetes mellitus without complications: Secondary | ICD-10-CM | POA: Diagnosis not present

## 2017-05-05 DIAGNOSIS — Z794 Long term (current) use of insulin: Secondary | ICD-10-CM | POA: Diagnosis not present

## 2017-06-02 DIAGNOSIS — E109 Type 1 diabetes mellitus without complications: Secondary | ICD-10-CM | POA: Diagnosis not present

## 2017-06-02 DIAGNOSIS — Z794 Long term (current) use of insulin: Secondary | ICD-10-CM | POA: Diagnosis not present

## 2017-06-03 DIAGNOSIS — H40003 Preglaucoma, unspecified, bilateral: Secondary | ICD-10-CM | POA: Diagnosis not present

## 2017-06-03 DIAGNOSIS — H35371 Puckering of macula, right eye: Secondary | ICD-10-CM | POA: Diagnosis not present

## 2017-06-03 DIAGNOSIS — H4720 Unspecified optic atrophy: Secondary | ICD-10-CM | POA: Diagnosis not present

## 2017-06-03 DIAGNOSIS — E119 Type 2 diabetes mellitus without complications: Secondary | ICD-10-CM | POA: Diagnosis not present

## 2017-06-13 DIAGNOSIS — E109 Type 1 diabetes mellitus without complications: Secondary | ICD-10-CM | POA: Diagnosis not present

## 2017-06-13 DIAGNOSIS — Z794 Long term (current) use of insulin: Secondary | ICD-10-CM | POA: Diagnosis not present

## 2017-06-17 DIAGNOSIS — H4720 Unspecified optic atrophy: Secondary | ICD-10-CM | POA: Diagnosis not present

## 2017-07-05 DIAGNOSIS — I10 Essential (primary) hypertension: Secondary | ICD-10-CM | POA: Diagnosis not present

## 2017-07-05 DIAGNOSIS — E1040 Type 1 diabetes mellitus with diabetic neuropathy, unspecified: Secondary | ICD-10-CM | POA: Diagnosis not present

## 2017-07-11 DIAGNOSIS — H35371 Puckering of macula, right eye: Secondary | ICD-10-CM | POA: Diagnosis not present

## 2017-07-11 DIAGNOSIS — E119 Type 2 diabetes mellitus without complications: Secondary | ICD-10-CM | POA: Diagnosis not present

## 2017-07-11 DIAGNOSIS — H40001 Preglaucoma, unspecified, right eye: Secondary | ICD-10-CM | POA: Diagnosis not present

## 2017-07-11 DIAGNOSIS — H401131 Primary open-angle glaucoma, bilateral, mild stage: Secondary | ICD-10-CM | POA: Diagnosis not present

## 2017-07-11 DIAGNOSIS — H4720 Unspecified optic atrophy: Secondary | ICD-10-CM | POA: Diagnosis not present

## 2017-07-11 DIAGNOSIS — H40051 Ocular hypertension, right eye: Secondary | ICD-10-CM | POA: Diagnosis not present

## 2017-07-12 DIAGNOSIS — E1040 Type 1 diabetes mellitus with diabetic neuropathy, unspecified: Secondary | ICD-10-CM | POA: Diagnosis not present

## 2017-07-26 DIAGNOSIS — Z794 Long term (current) use of insulin: Secondary | ICD-10-CM | POA: Diagnosis not present

## 2017-07-26 DIAGNOSIS — E109 Type 1 diabetes mellitus without complications: Secondary | ICD-10-CM | POA: Diagnosis not present

## 2017-09-02 DIAGNOSIS — Z794 Long term (current) use of insulin: Secondary | ICD-10-CM | POA: Diagnosis not present

## 2017-09-02 DIAGNOSIS — E109 Type 1 diabetes mellitus without complications: Secondary | ICD-10-CM | POA: Diagnosis not present

## 2017-09-27 DIAGNOSIS — E1040 Type 1 diabetes mellitus with diabetic neuropathy, unspecified: Secondary | ICD-10-CM | POA: Diagnosis not present

## 2017-10-10 DIAGNOSIS — E7800 Pure hypercholesterolemia, unspecified: Secondary | ICD-10-CM | POA: Diagnosis not present

## 2017-10-10 DIAGNOSIS — E1040 Type 1 diabetes mellitus with diabetic neuropathy, unspecified: Secondary | ICD-10-CM | POA: Diagnosis not present

## 2017-10-13 DIAGNOSIS — E1040 Type 1 diabetes mellitus with diabetic neuropathy, unspecified: Secondary | ICD-10-CM | POA: Diagnosis not present

## 2017-10-13 DIAGNOSIS — Z0001 Encounter for general adult medical examination with abnormal findings: Secondary | ICD-10-CM | POA: Diagnosis not present

## 2017-10-13 DIAGNOSIS — E10319 Type 1 diabetes mellitus with unspecified diabetic retinopathy without macular edema: Secondary | ICD-10-CM | POA: Diagnosis not present

## 2017-10-13 DIAGNOSIS — I10 Essential (primary) hypertension: Secondary | ICD-10-CM | POA: Diagnosis not present

## 2017-10-28 DIAGNOSIS — R161 Splenomegaly, not elsewhere classified: Secondary | ICD-10-CM | POA: Diagnosis not present

## 2017-11-01 DIAGNOSIS — Z23 Encounter for immunization: Secondary | ICD-10-CM | POA: Diagnosis not present

## 2017-11-08 DIAGNOSIS — Z794 Long term (current) use of insulin: Secondary | ICD-10-CM | POA: Diagnosis not present

## 2017-11-08 DIAGNOSIS — E109 Type 1 diabetes mellitus without complications: Secondary | ICD-10-CM | POA: Diagnosis not present

## 2017-12-13 DIAGNOSIS — E109 Type 1 diabetes mellitus without complications: Secondary | ICD-10-CM | POA: Diagnosis not present

## 2017-12-13 DIAGNOSIS — Z794 Long term (current) use of insulin: Secondary | ICD-10-CM | POA: Diagnosis not present

## 2017-12-21 DIAGNOSIS — Z794 Long term (current) use of insulin: Secondary | ICD-10-CM | POA: Diagnosis not present

## 2017-12-21 DIAGNOSIS — E109 Type 1 diabetes mellitus without complications: Secondary | ICD-10-CM | POA: Diagnosis not present

## 2017-12-29 DIAGNOSIS — E1040 Type 1 diabetes mellitus with diabetic neuropathy, unspecified: Secondary | ICD-10-CM | POA: Diagnosis not present

## 2018-01-02 ENCOUNTER — Encounter (INDEPENDENT_AMBULATORY_CARE_PROVIDER_SITE_OTHER): Payer: Medicare Other | Admitting: Ophthalmology

## 2018-01-02 DIAGNOSIS — H43813 Vitreous degeneration, bilateral: Secondary | ICD-10-CM

## 2018-01-02 DIAGNOSIS — I10 Essential (primary) hypertension: Secondary | ICD-10-CM

## 2018-01-02 DIAGNOSIS — E103293 Type 1 diabetes mellitus with mild nonproliferative diabetic retinopathy without macular edema, bilateral: Secondary | ICD-10-CM

## 2018-01-02 DIAGNOSIS — H35371 Puckering of macula, right eye: Secondary | ICD-10-CM

## 2018-01-02 DIAGNOSIS — E10319 Type 1 diabetes mellitus with unspecified diabetic retinopathy without macular edema: Secondary | ICD-10-CM

## 2018-01-02 DIAGNOSIS — H35033 Hypertensive retinopathy, bilateral: Secondary | ICD-10-CM

## 2018-01-13 DIAGNOSIS — E119 Type 2 diabetes mellitus without complications: Secondary | ICD-10-CM | POA: Diagnosis not present

## 2018-01-13 DIAGNOSIS — H524 Presbyopia: Secondary | ICD-10-CM | POA: Diagnosis not present

## 2018-01-13 DIAGNOSIS — H4720 Unspecified optic atrophy: Secondary | ICD-10-CM | POA: Diagnosis not present

## 2018-01-13 DIAGNOSIS — H5210 Myopia, unspecified eye: Secondary | ICD-10-CM | POA: Diagnosis not present

## 2018-01-13 DIAGNOSIS — H401131 Primary open-angle glaucoma, bilateral, mild stage: Secondary | ICD-10-CM | POA: Diagnosis not present

## 2018-01-13 DIAGNOSIS — H35371 Puckering of macula, right eye: Secondary | ICD-10-CM | POA: Diagnosis not present

## 2018-01-13 DIAGNOSIS — Z9889 Other specified postprocedural states: Secondary | ICD-10-CM | POA: Diagnosis not present

## 2018-01-13 DIAGNOSIS — H52203 Unspecified astigmatism, bilateral: Secondary | ICD-10-CM | POA: Diagnosis not present

## 2018-01-13 DIAGNOSIS — H11003 Unspecified pterygium of eye, bilateral: Secondary | ICD-10-CM | POA: Diagnosis not present

## 2018-01-13 DIAGNOSIS — Z794 Long term (current) use of insulin: Secondary | ICD-10-CM | POA: Diagnosis not present

## 2018-01-13 DIAGNOSIS — Z961 Presence of intraocular lens: Secondary | ICD-10-CM | POA: Diagnosis not present

## 2018-01-17 DIAGNOSIS — E10319 Type 1 diabetes mellitus with unspecified diabetic retinopathy without macular edema: Secondary | ICD-10-CM | POA: Diagnosis not present

## 2018-01-17 DIAGNOSIS — E1040 Type 1 diabetes mellitus with diabetic neuropathy, unspecified: Secondary | ICD-10-CM | POA: Diagnosis not present

## 2018-01-17 DIAGNOSIS — I10 Essential (primary) hypertension: Secondary | ICD-10-CM | POA: Diagnosis not present

## 2018-01-17 DIAGNOSIS — Z9889 Other specified postprocedural states: Secondary | ICD-10-CM | POA: Diagnosis not present

## 2018-02-02 DIAGNOSIS — Z794 Long term (current) use of insulin: Secondary | ICD-10-CM | POA: Diagnosis not present

## 2018-02-02 DIAGNOSIS — E109 Type 1 diabetes mellitus without complications: Secondary | ICD-10-CM | POA: Diagnosis not present

## 2018-03-02 DIAGNOSIS — R1114 Bilious vomiting: Secondary | ICD-10-CM | POA: Diagnosis not present

## 2018-03-02 DIAGNOSIS — Z87898 Personal history of other specified conditions: Secondary | ICD-10-CM | POA: Diagnosis not present

## 2018-03-02 DIAGNOSIS — J00 Acute nasopharyngitis [common cold]: Secondary | ICD-10-CM | POA: Diagnosis not present

## 2018-03-06 DIAGNOSIS — N400 Enlarged prostate without lower urinary tract symptoms: Secondary | ICD-10-CM | POA: Diagnosis not present

## 2018-03-06 DIAGNOSIS — N138 Other obstructive and reflux uropathy: Secondary | ICD-10-CM | POA: Diagnosis not present

## 2018-03-06 DIAGNOSIS — N1339 Other hydronephrosis: Secondary | ICD-10-CM | POA: Diagnosis not present

## 2018-03-06 DIAGNOSIS — N401 Enlarged prostate with lower urinary tract symptoms: Secondary | ICD-10-CM | POA: Diagnosis not present

## 2018-03-06 DIAGNOSIS — N1330 Unspecified hydronephrosis: Secondary | ICD-10-CM | POA: Diagnosis not present

## 2018-03-06 DIAGNOSIS — R972 Elevated prostate specific antigen [PSA]: Secondary | ICD-10-CM | POA: Diagnosis not present

## 2018-03-08 DIAGNOSIS — Z794 Long term (current) use of insulin: Secondary | ICD-10-CM | POA: Diagnosis not present

## 2018-03-08 DIAGNOSIS — E1040 Type 1 diabetes mellitus with diabetic neuropathy, unspecified: Secondary | ICD-10-CM | POA: Diagnosis not present

## 2018-03-08 DIAGNOSIS — E109 Type 1 diabetes mellitus without complications: Secondary | ICD-10-CM | POA: Diagnosis not present

## 2018-03-10 DIAGNOSIS — K831 Obstruction of bile duct: Secondary | ICD-10-CM | POA: Diagnosis not present

## 2018-03-13 DIAGNOSIS — E109 Type 1 diabetes mellitus without complications: Secondary | ICD-10-CM | POA: Diagnosis not present

## 2018-03-13 DIAGNOSIS — Z794 Long term (current) use of insulin: Secondary | ICD-10-CM | POA: Diagnosis not present

## 2018-03-15 IMAGING — US US ABDOMEN COMPLETE
1 series · 13 of 25 positions shown · non-contrast
Comparison: Abdominal ultrasound July 20, 2013

CLINICAL DATA: Bilateral upper quadrant abdominal pain for the past
week ; history of previous ERCP

EXAM:
ABDOMEN ULTRASOUND COMPLETE

[Series 1: us abdomen complete · 0.22mm/px · 13 of 94 slices shown]
[im 1/94]
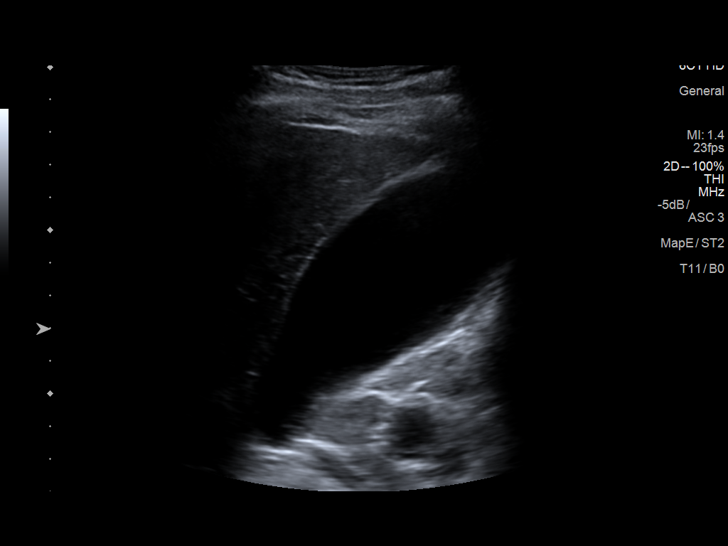
[im 8/94]
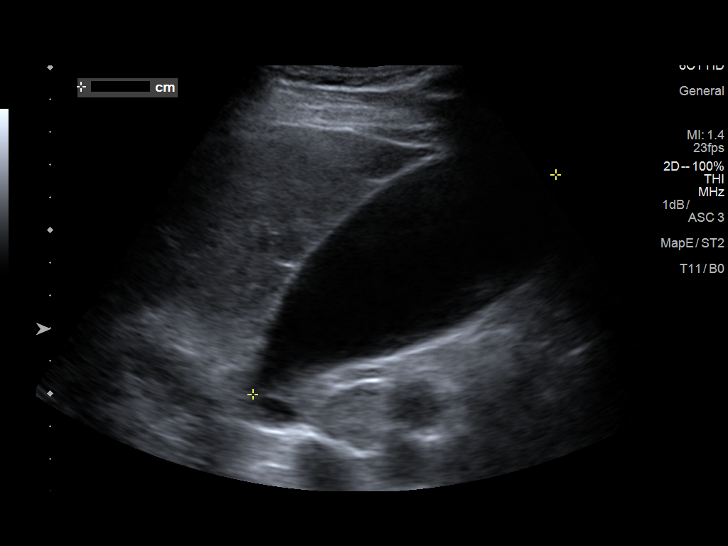
[im 16/94]
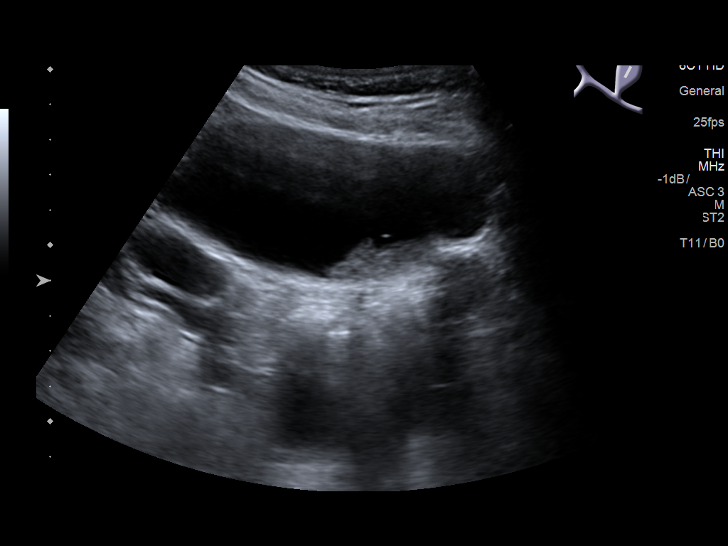
[im 24/94]
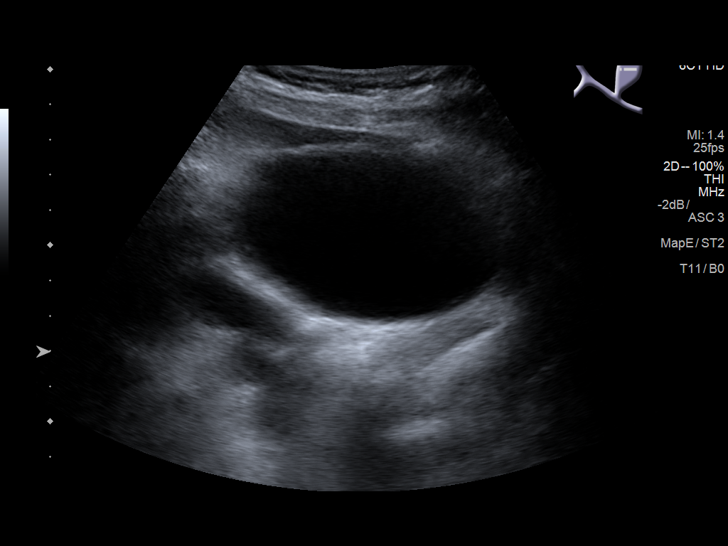
[im 32/94]
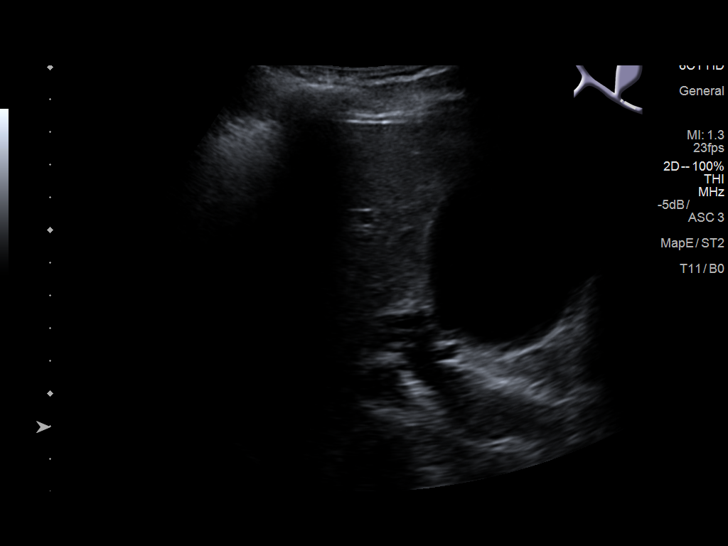
[im 39/94]
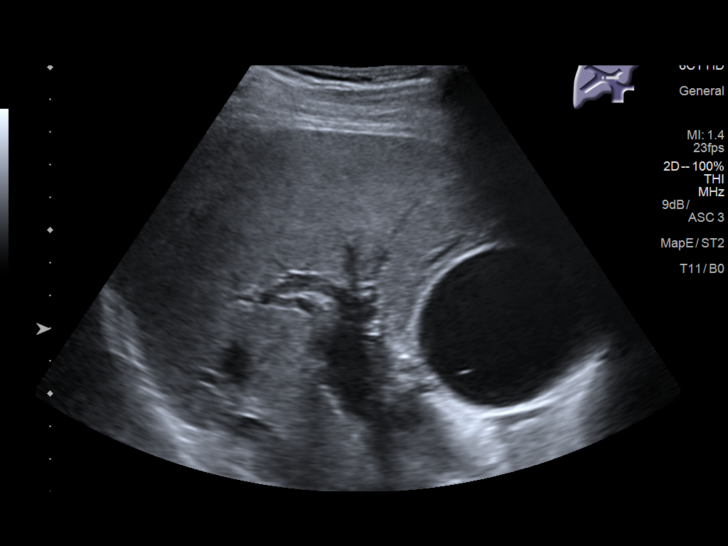
[im 47/94]
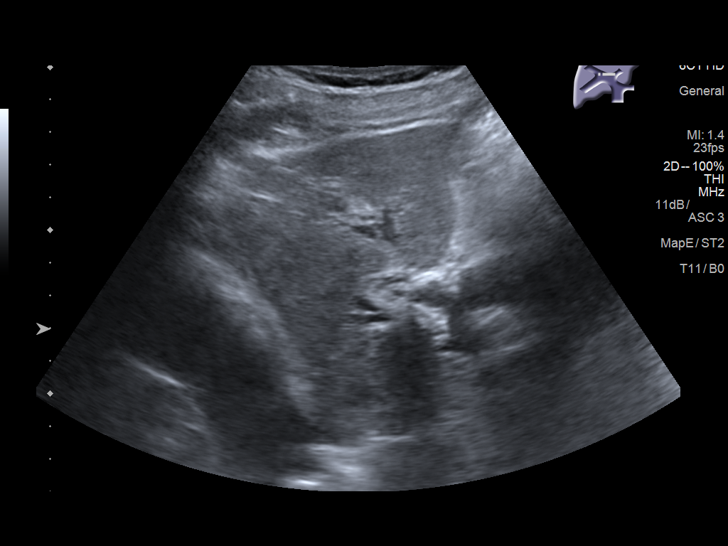
[im 55/94]
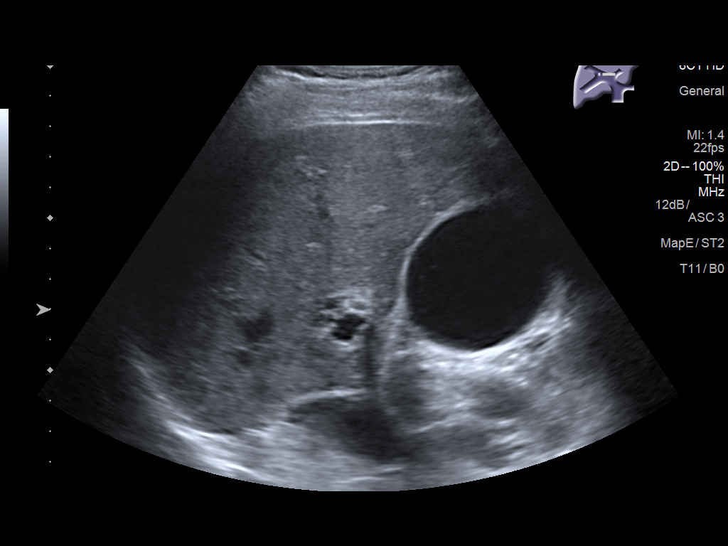
[im 63/94]
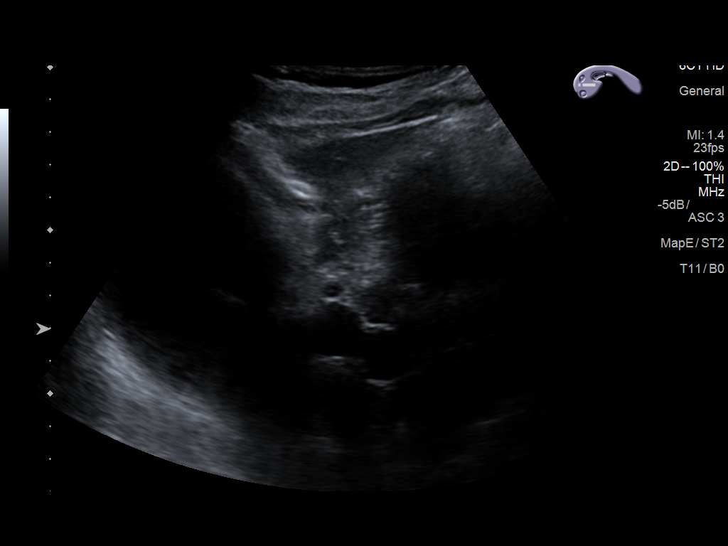
[im 70/94]
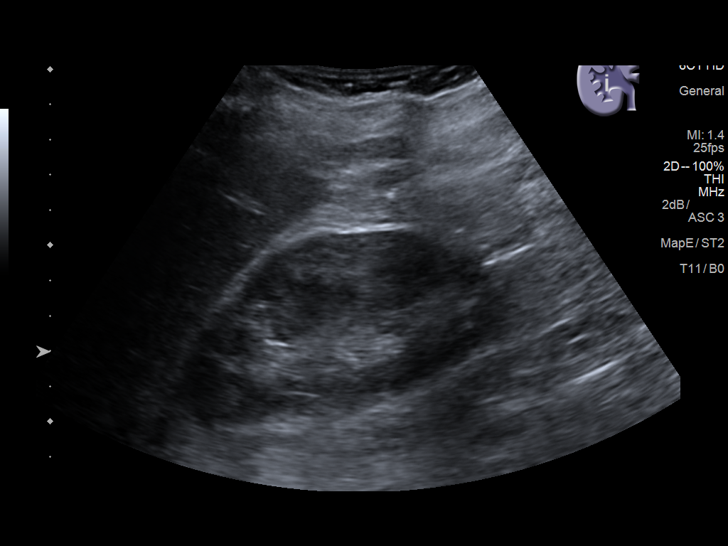
[im 78/94]
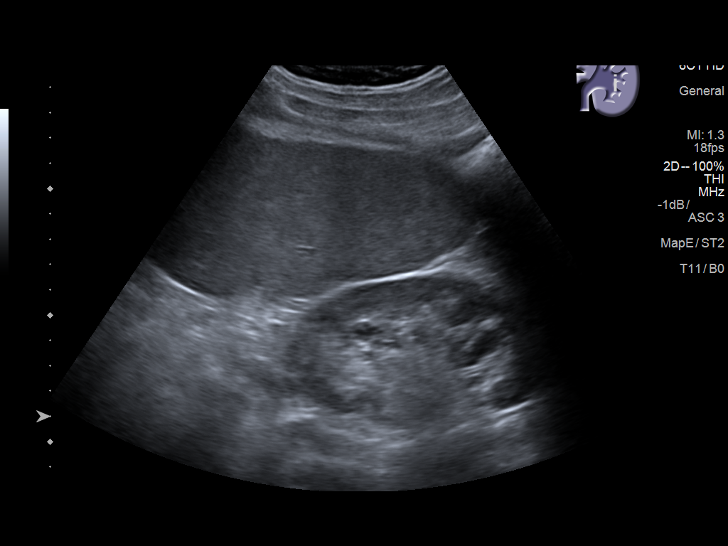
[im 86/94]
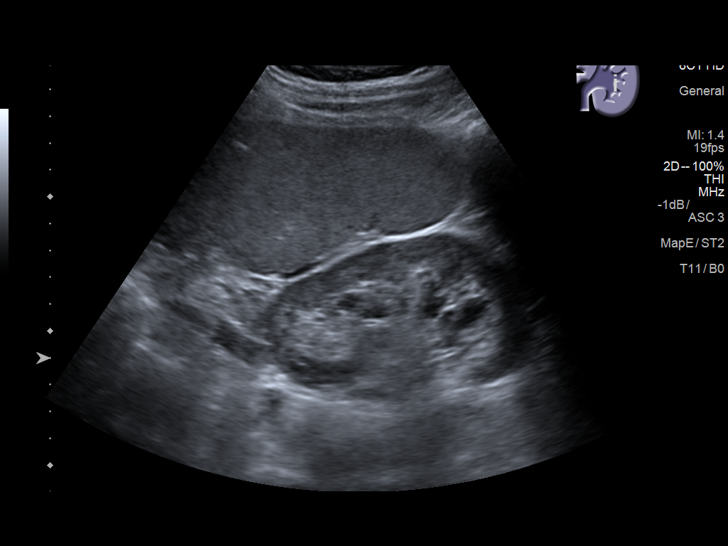
[im 94/94]
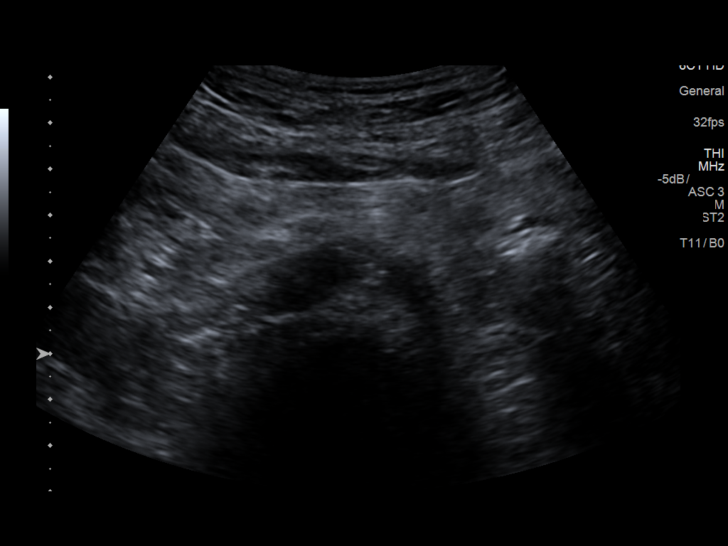

[13 of 25 positions shown; findings below may reference images not displayed]

FINDINGS: Gallbladder: The gallbladder is distended. There is no gallbladder
wall thickening. No definite intramural or pericholecystic fluid
collections are observed. There is sludge in the gallbladder lumen.
There is no positive sonographic Murphy's sign.

Common bile duct: Diameter: 13.6 mm. This has decreased since a 23
mm measurement observed in Saturday June, 2013. No intraluminal stones are
observed.

Liver: There is intrahepatic ductal dilation. There is an
irregularly marginated right lobe hepatic cyst containing internal
echoes measuring 2.4 cm in greatest dimension. There are other
smaller cysts.

IVC: No abnormality visualized.

Pancreas: Bowel gas limited evaluation of the pancreas.

Spleen: Splenomegaly with calculated volume of 1195 cc

Right Kidney: Length: 11.1 cm. Echogenicity within normal limits. No
mass or hydronephrosis visualized.

Left Kidney: Length: 11.1 cm. Echogenicity within normal limits. No
mass visualized. Mild splitting of the central echo complex.

Abdominal aorta: No aneurysm visualized.

Other findings: None.
IMPRESSION: 1. Distended gallbladder without sonographic evidence of acute
cholecystitis. There is a small amount of sludge present but no
discrete stones are demonstrated.
2. Chronic common bile ductal dilation which has decreased in
severity since the previous study. No intraluminal stones are
observed.
3. Non simple appearing cyst in the liver which has been previously
demonstrated. Persistent intrahepatic ductal dilation. Limited
visualization of the pancreas.
4. Splenomegaly which has become more conspicuous since the previous
study.
5. Mild left-sided hydronephrosis which has been previously
demonstrated.

Given the patient's hepatobiliary history, it may be useful to
consider the patient for abdominal MRI with attention to the
pancreas and liver.

## 2018-03-17 DIAGNOSIS — R1011 Right upper quadrant pain: Secondary | ICD-10-CM | POA: Diagnosis not present

## 2018-03-17 DIAGNOSIS — K828 Other specified diseases of gallbladder: Secondary | ICD-10-CM | POA: Diagnosis not present

## 2018-03-17 DIAGNOSIS — K219 Gastro-esophageal reflux disease without esophagitis: Secondary | ICD-10-CM | POA: Diagnosis not present

## 2018-04-18 DIAGNOSIS — E1040 Type 1 diabetes mellitus with diabetic neuropathy, unspecified: Secondary | ICD-10-CM | POA: Diagnosis not present

## 2018-04-26 DIAGNOSIS — K828 Other specified diseases of gallbladder: Secondary | ICD-10-CM | POA: Diagnosis not present

## 2018-04-26 DIAGNOSIS — K831 Obstruction of bile duct: Secondary | ICD-10-CM | POA: Diagnosis not present

## 2018-05-02 DIAGNOSIS — K828 Other specified diseases of gallbladder: Secondary | ICD-10-CM | POA: Diagnosis not present

## 2018-05-02 DIAGNOSIS — Z794 Long term (current) use of insulin: Secondary | ICD-10-CM | POA: Diagnosis not present

## 2018-05-02 DIAGNOSIS — E109 Type 1 diabetes mellitus without complications: Secondary | ICD-10-CM | POA: Diagnosis not present

## 2018-05-02 DIAGNOSIS — K831 Obstruction of bile duct: Secondary | ICD-10-CM | POA: Diagnosis not present

## 2018-05-02 DIAGNOSIS — Z79899 Other long term (current) drug therapy: Secondary | ICD-10-CM | POA: Diagnosis not present

## 2018-05-10 DIAGNOSIS — E109 Type 1 diabetes mellitus without complications: Secondary | ICD-10-CM | POA: Diagnosis not present

## 2018-05-10 DIAGNOSIS — Z794 Long term (current) use of insulin: Secondary | ICD-10-CM | POA: Diagnosis not present

## 2018-05-15 DIAGNOSIS — R161 Splenomegaly, not elsewhere classified: Secondary | ICD-10-CM | POA: Diagnosis not present

## 2018-05-15 DIAGNOSIS — K838 Other specified diseases of biliary tract: Secondary | ICD-10-CM | POA: Diagnosis not present

## 2018-05-17 DIAGNOSIS — K8309 Other cholangitis: Secondary | ICD-10-CM | POA: Diagnosis not present

## 2018-06-01 DIAGNOSIS — E1040 Type 1 diabetes mellitus with diabetic neuropathy, unspecified: Secondary | ICD-10-CM | POA: Diagnosis not present

## 2018-06-14 DIAGNOSIS — E109 Type 1 diabetes mellitus without complications: Secondary | ICD-10-CM | POA: Diagnosis not present

## 2018-06-14 DIAGNOSIS — Z794 Long term (current) use of insulin: Secondary | ICD-10-CM | POA: Diagnosis not present

## 2018-06-15 DIAGNOSIS — K8309 Other cholangitis: Secondary | ICD-10-CM | POA: Diagnosis not present

## 2018-06-27 DIAGNOSIS — D72820 Lymphocytosis (symptomatic): Secondary | ICD-10-CM | POA: Diagnosis not present

## 2018-06-27 DIAGNOSIS — K8309 Other cholangitis: Secondary | ICD-10-CM | POA: Diagnosis not present

## 2018-07-05 DIAGNOSIS — K8309 Other cholangitis: Secondary | ICD-10-CM | POA: Diagnosis not present

## 2018-07-05 DIAGNOSIS — D72820 Lymphocytosis (symptomatic): Secondary | ICD-10-CM | POA: Diagnosis not present

## 2018-07-05 DIAGNOSIS — R590 Localized enlarged lymph nodes: Secondary | ICD-10-CM | POA: Diagnosis not present

## 2018-07-14 DIAGNOSIS — H35371 Puckering of macula, right eye: Secondary | ICD-10-CM | POA: Diagnosis not present

## 2018-07-14 DIAGNOSIS — Z961 Presence of intraocular lens: Secondary | ICD-10-CM | POA: Diagnosis not present

## 2018-07-14 DIAGNOSIS — Z794 Long term (current) use of insulin: Secondary | ICD-10-CM | POA: Diagnosis not present

## 2018-07-14 DIAGNOSIS — H401131 Primary open-angle glaucoma, bilateral, mild stage: Secondary | ICD-10-CM | POA: Diagnosis not present

## 2018-07-14 DIAGNOSIS — E119 Type 2 diabetes mellitus without complications: Secondary | ICD-10-CM | POA: Diagnosis not present

## 2018-07-14 DIAGNOSIS — Z1159 Encounter for screening for other viral diseases: Secondary | ICD-10-CM | POA: Diagnosis not present

## 2018-07-17 DIAGNOSIS — Z1159 Encounter for screening for other viral diseases: Secondary | ICD-10-CM | POA: Diagnosis not present

## 2018-07-18 DIAGNOSIS — E1040 Type 1 diabetes mellitus with diabetic neuropathy, unspecified: Secondary | ICD-10-CM | POA: Diagnosis not present

## 2018-07-21 DIAGNOSIS — E109 Type 1 diabetes mellitus without complications: Secondary | ICD-10-CM | POA: Diagnosis not present

## 2018-07-21 DIAGNOSIS — R935 Abnormal findings on diagnostic imaging of other abdominal regions, including retroperitoneum: Secondary | ICD-10-CM | POA: Diagnosis not present

## 2018-07-21 DIAGNOSIS — K839 Disease of biliary tract, unspecified: Secondary | ICD-10-CM | POA: Diagnosis not present

## 2018-07-21 DIAGNOSIS — K831 Obstruction of bile duct: Secondary | ICD-10-CM | POA: Diagnosis not present

## 2018-07-21 DIAGNOSIS — K838 Other specified diseases of biliary tract: Secondary | ICD-10-CM | POA: Diagnosis not present

## 2018-07-21 DIAGNOSIS — Q444 Choledochal cyst: Secondary | ICD-10-CM | POA: Diagnosis not present

## 2018-07-28 DIAGNOSIS — E119 Type 2 diabetes mellitus without complications: Secondary | ICD-10-CM | POA: Diagnosis not present

## 2018-07-28 DIAGNOSIS — D72820 Lymphocytosis (symptomatic): Secondary | ICD-10-CM | POA: Diagnosis not present

## 2018-08-07 DIAGNOSIS — D72820 Lymphocytosis (symptomatic): Secondary | ICD-10-CM | POA: Diagnosis not present

## 2018-08-07 DIAGNOSIS — R109 Unspecified abdominal pain: Secondary | ICD-10-CM | POA: Diagnosis not present

## 2018-08-07 DIAGNOSIS — R079 Chest pain, unspecified: Secondary | ICD-10-CM | POA: Diagnosis not present

## 2018-08-09 DIAGNOSIS — Z0120 Encounter for dental examination and cleaning without abnormal findings: Secondary | ICD-10-CM | POA: Diagnosis not present

## 2018-08-14 DIAGNOSIS — Z794 Long term (current) use of insulin: Secondary | ICD-10-CM | POA: Diagnosis not present

## 2018-08-14 DIAGNOSIS — E109 Type 1 diabetes mellitus without complications: Secondary | ICD-10-CM | POA: Diagnosis not present

## 2018-08-26 DIAGNOSIS — E1040 Type 1 diabetes mellitus with diabetic neuropathy, unspecified: Secondary | ICD-10-CM | POA: Diagnosis not present

## 2018-09-13 DIAGNOSIS — Z794 Long term (current) use of insulin: Secondary | ICD-10-CM | POA: Diagnosis not present

## 2018-09-13 DIAGNOSIS — E109 Type 1 diabetes mellitus without complications: Secondary | ICD-10-CM | POA: Diagnosis not present

## 2018-10-09 ENCOUNTER — Encounter (INDEPENDENT_AMBULATORY_CARE_PROVIDER_SITE_OTHER): Payer: Medicare Other | Admitting: Ophthalmology

## 2018-10-16 DIAGNOSIS — E1040 Type 1 diabetes mellitus with diabetic neuropathy, unspecified: Secondary | ICD-10-CM | POA: Diagnosis not present

## 2018-10-16 DIAGNOSIS — E7800 Pure hypercholesterolemia, unspecified: Secondary | ICD-10-CM | POA: Diagnosis not present

## 2018-10-18 DIAGNOSIS — D696 Thrombocytopenia, unspecified: Secondary | ICD-10-CM | POA: Diagnosis not present

## 2018-10-18 DIAGNOSIS — Z0000 Encounter for general adult medical examination without abnormal findings: Secondary | ICD-10-CM | POA: Diagnosis not present

## 2018-10-18 DIAGNOSIS — R748 Abnormal levels of other serum enzymes: Secondary | ICD-10-CM | POA: Diagnosis not present

## 2018-10-18 DIAGNOSIS — D472 Monoclonal gammopathy: Secondary | ICD-10-CM | POA: Diagnosis not present

## 2018-11-21 DIAGNOSIS — Z794 Long term (current) use of insulin: Secondary | ICD-10-CM | POA: Diagnosis not present

## 2018-11-21 DIAGNOSIS — E109 Type 1 diabetes mellitus without complications: Secondary | ICD-10-CM | POA: Diagnosis not present

## 2018-12-07 DIAGNOSIS — K7689 Other specified diseases of liver: Secondary | ICD-10-CM | POA: Diagnosis not present

## 2018-12-07 DIAGNOSIS — R161 Splenomegaly, not elsewhere classified: Secondary | ICD-10-CM | POA: Diagnosis not present

## 2018-12-07 DIAGNOSIS — K828 Other specified diseases of gallbladder: Secondary | ICD-10-CM | POA: Diagnosis not present

## 2018-12-07 DIAGNOSIS — R748 Abnormal levels of other serum enzymes: Secondary | ICD-10-CM | POA: Diagnosis not present

## 2018-12-12 DIAGNOSIS — X32XXXA Exposure to sunlight, initial encounter: Secondary | ICD-10-CM | POA: Diagnosis not present

## 2018-12-12 DIAGNOSIS — L570 Actinic keratosis: Secondary | ICD-10-CM | POA: Diagnosis not present

## 2018-12-12 DIAGNOSIS — L720 Epidermal cyst: Secondary | ICD-10-CM | POA: Diagnosis not present

## 2018-12-12 DIAGNOSIS — D044 Carcinoma in situ of skin of scalp and neck: Secondary | ICD-10-CM | POA: Diagnosis not present

## 2018-12-14 DIAGNOSIS — Z794 Long term (current) use of insulin: Secondary | ICD-10-CM | POA: Diagnosis not present

## 2018-12-14 DIAGNOSIS — E109 Type 1 diabetes mellitus without complications: Secondary | ICD-10-CM | POA: Diagnosis not present

## 2018-12-20 DIAGNOSIS — Z20828 Contact with and (suspected) exposure to other viral communicable diseases: Secondary | ICD-10-CM | POA: Diagnosis not present

## 2018-12-20 DIAGNOSIS — R945 Abnormal results of liver function studies: Secondary | ICD-10-CM | POA: Diagnosis not present

## 2018-12-20 DIAGNOSIS — R7989 Other specified abnormal findings of blood chemistry: Secondary | ICD-10-CM | POA: Diagnosis not present

## 2018-12-20 DIAGNOSIS — R509 Fever, unspecified: Secondary | ICD-10-CM | POA: Diagnosis not present

## 2018-12-20 DIAGNOSIS — R112 Nausea with vomiting, unspecified: Secondary | ICD-10-CM | POA: Diagnosis not present

## 2018-12-20 DIAGNOSIS — R918 Other nonspecific abnormal finding of lung field: Secondary | ICD-10-CM | POA: Diagnosis not present

## 2018-12-20 DIAGNOSIS — K838 Other specified diseases of biliary tract: Secondary | ICD-10-CM | POA: Diagnosis not present

## 2018-12-20 DIAGNOSIS — K8309 Other cholangitis: Secondary | ICD-10-CM | POA: Diagnosis not present

## 2018-12-20 DIAGNOSIS — K8032 Calculus of bile duct with acute cholangitis without obstruction: Secondary | ICD-10-CM | POA: Diagnosis not present

## 2018-12-20 DIAGNOSIS — Z9641 Presence of insulin pump (external) (internal): Secondary | ICD-10-CM | POA: Diagnosis not present

## 2018-12-20 DIAGNOSIS — J189 Pneumonia, unspecified organism: Secondary | ICD-10-CM | POA: Diagnosis not present

## 2018-12-20 DIAGNOSIS — J45909 Unspecified asthma, uncomplicated: Secondary | ICD-10-CM | POA: Diagnosis not present

## 2018-12-20 DIAGNOSIS — I959 Hypotension, unspecified: Secondary | ICD-10-CM | POA: Diagnosis not present

## 2018-12-20 DIAGNOSIS — I10 Essential (primary) hypertension: Secondary | ICD-10-CM | POA: Diagnosis not present

## 2018-12-20 DIAGNOSIS — K7689 Other specified diseases of liver: Secondary | ICD-10-CM | POA: Diagnosis not present

## 2018-12-20 DIAGNOSIS — E109 Type 1 diabetes mellitus without complications: Secondary | ICD-10-CM | POA: Diagnosis not present

## 2018-12-20 DIAGNOSIS — Z794 Long term (current) use of insulin: Secondary | ICD-10-CM | POA: Diagnosis not present

## 2018-12-20 DIAGNOSIS — K831 Obstruction of bile duct: Secondary | ICD-10-CM | POA: Diagnosis not present

## 2019-01-04 DIAGNOSIS — E109 Type 1 diabetes mellitus without complications: Secondary | ICD-10-CM | POA: Diagnosis not present

## 2019-01-04 DIAGNOSIS — K8309 Other cholangitis: Secondary | ICD-10-CM | POA: Diagnosis not present

## 2019-01-04 DIAGNOSIS — K831 Obstruction of bile duct: Secondary | ICD-10-CM | POA: Diagnosis not present

## 2019-01-09 DIAGNOSIS — I10 Essential (primary) hypertension: Secondary | ICD-10-CM | POA: Diagnosis not present

## 2019-01-09 DIAGNOSIS — E7800 Pure hypercholesterolemia, unspecified: Secondary | ICD-10-CM | POA: Diagnosis not present

## 2019-01-09 DIAGNOSIS — E1040 Type 1 diabetes mellitus with diabetic neuropathy, unspecified: Secondary | ICD-10-CM | POA: Diagnosis not present

## 2019-01-18 DIAGNOSIS — R161 Splenomegaly, not elsewhere classified: Secondary | ICD-10-CM | POA: Diagnosis not present

## 2019-01-18 DIAGNOSIS — R7989 Other specified abnormal findings of blood chemistry: Secondary | ICD-10-CM | POA: Diagnosis not present

## 2019-01-18 DIAGNOSIS — K831 Obstruction of bile duct: Secondary | ICD-10-CM | POA: Diagnosis not present

## 2019-02-02 DIAGNOSIS — Z7189 Other specified counseling: Secondary | ICD-10-CM | POA: Diagnosis not present

## 2019-02-02 DIAGNOSIS — D72820 Lymphocytosis (symptomatic): Secondary | ICD-10-CM | POA: Diagnosis not present

## 2019-02-20 DIAGNOSIS — E109 Type 1 diabetes mellitus without complications: Secondary | ICD-10-CM | POA: Diagnosis not present

## 2019-02-20 DIAGNOSIS — Z794 Long term (current) use of insulin: Secondary | ICD-10-CM | POA: Diagnosis not present

## 2019-03-01 DIAGNOSIS — Z20828 Contact with and (suspected) exposure to other viral communicable diseases: Secondary | ICD-10-CM | POA: Diagnosis not present

## 2019-03-05 DIAGNOSIS — R972 Elevated prostate specific antigen [PSA]: Secondary | ICD-10-CM | POA: Diagnosis not present

## 2019-03-05 DIAGNOSIS — N1339 Other hydronephrosis: Secondary | ICD-10-CM | POA: Diagnosis not present

## 2019-03-05 DIAGNOSIS — N481 Balanitis: Secondary | ICD-10-CM | POA: Diagnosis not present

## 2019-03-05 DIAGNOSIS — N401 Enlarged prostate with lower urinary tract symptoms: Secondary | ICD-10-CM | POA: Diagnosis not present

## 2019-03-08 DIAGNOSIS — K838 Other specified diseases of biliary tract: Secondary | ICD-10-CM | POA: Diagnosis not present

## 2019-03-08 DIAGNOSIS — K831 Obstruction of bile duct: Secondary | ICD-10-CM | POA: Diagnosis not present

## 2019-03-12 DIAGNOSIS — R161 Splenomegaly, not elsewhere classified: Secondary | ICD-10-CM | POA: Diagnosis not present

## 2019-03-12 DIAGNOSIS — K831 Obstruction of bile duct: Secondary | ICD-10-CM | POA: Diagnosis not present

## 2019-03-22 DIAGNOSIS — E109 Type 1 diabetes mellitus without complications: Secondary | ICD-10-CM | POA: Diagnosis not present

## 2019-03-22 DIAGNOSIS — Z794 Long term (current) use of insulin: Secondary | ICD-10-CM | POA: Diagnosis not present

## 2019-04-02 DIAGNOSIS — R161 Splenomegaly, not elsewhere classified: Secondary | ICD-10-CM | POA: Diagnosis not present

## 2019-04-02 DIAGNOSIS — K7689 Other specified diseases of liver: Secondary | ICD-10-CM | POA: Diagnosis not present

## 2019-04-02 DIAGNOSIS — K831 Obstruction of bile duct: Secondary | ICD-10-CM | POA: Diagnosis not present

## 2019-04-02 DIAGNOSIS — R599 Enlarged lymph nodes, unspecified: Secondary | ICD-10-CM | POA: Diagnosis not present

## 2019-04-17 DIAGNOSIS — E1040 Type 1 diabetes mellitus with diabetic neuropathy, unspecified: Secondary | ICD-10-CM | POA: Diagnosis not present

## 2019-04-27 DIAGNOSIS — K7689 Other specified diseases of liver: Secondary | ICD-10-CM | POA: Diagnosis not present

## 2019-04-27 DIAGNOSIS — D72820 Lymphocytosis (symptomatic): Secondary | ICD-10-CM | POA: Diagnosis not present

## 2019-04-27 DIAGNOSIS — R590 Localized enlarged lymph nodes: Secondary | ICD-10-CM | POA: Diagnosis not present

## 2019-04-27 DIAGNOSIS — R161 Splenomegaly, not elsewhere classified: Secondary | ICD-10-CM | POA: Diagnosis not present

## 2019-04-30 DIAGNOSIS — D72820 Lymphocytosis (symptomatic): Secondary | ICD-10-CM | POA: Diagnosis not present

## 2019-04-30 DIAGNOSIS — Z20822 Contact with and (suspected) exposure to covid-19: Secondary | ICD-10-CM | POA: Diagnosis not present

## 2019-04-30 DIAGNOSIS — Z01812 Encounter for preprocedural laboratory examination: Secondary | ICD-10-CM | POA: Diagnosis not present

## 2019-05-04 DIAGNOSIS — R591 Generalized enlarged lymph nodes: Secondary | ICD-10-CM | POA: Diagnosis not present

## 2019-05-07 DIAGNOSIS — K831 Obstruction of bile duct: Secondary | ICD-10-CM | POA: Diagnosis not present

## 2019-05-19 DIAGNOSIS — R933 Abnormal findings on diagnostic imaging of other parts of digestive tract: Secondary | ICD-10-CM | POA: Diagnosis not present

## 2019-05-19 DIAGNOSIS — R112 Nausea with vomiting, unspecified: Secondary | ICD-10-CM | POA: Diagnosis not present

## 2019-05-19 DIAGNOSIS — R1011 Right upper quadrant pain: Secondary | ICD-10-CM | POA: Diagnosis not present

## 2019-05-19 DIAGNOSIS — Z9641 Presence of insulin pump (external) (internal): Secondary | ICD-10-CM | POA: Diagnosis not present

## 2019-05-19 DIAGNOSIS — Z794 Long term (current) use of insulin: Secondary | ICD-10-CM | POA: Diagnosis not present

## 2019-05-19 DIAGNOSIS — R109 Unspecified abdominal pain: Secondary | ICD-10-CM | POA: Diagnosis not present

## 2019-05-19 DIAGNOSIS — Z20822 Contact with and (suspected) exposure to covid-19: Secondary | ICD-10-CM | POA: Diagnosis not present

## 2019-05-19 DIAGNOSIS — R7989 Other specified abnormal findings of blood chemistry: Secondary | ICD-10-CM | POA: Diagnosis not present

## 2019-05-19 DIAGNOSIS — K8309 Other cholangitis: Secondary | ICD-10-CM | POA: Diagnosis not present

## 2019-05-19 DIAGNOSIS — R1013 Epigastric pain: Secondary | ICD-10-CM | POA: Diagnosis not present

## 2019-05-19 DIAGNOSIS — K920 Hematemesis: Secondary | ICD-10-CM | POA: Diagnosis not present

## 2019-05-19 DIAGNOSIS — E10649 Type 1 diabetes mellitus with hypoglycemia without coma: Secondary | ICD-10-CM | POA: Diagnosis not present

## 2019-05-19 DIAGNOSIS — I10 Essential (primary) hypertension: Secondary | ICD-10-CM | POA: Diagnosis not present

## 2019-05-19 DIAGNOSIS — K449 Diaphragmatic hernia without obstruction or gangrene: Secondary | ICD-10-CM | POA: Diagnosis not present

## 2019-05-19 DIAGNOSIS — R1084 Generalized abdominal pain: Secondary | ICD-10-CM | POA: Diagnosis not present

## 2019-05-19 DIAGNOSIS — K831 Obstruction of bile duct: Secondary | ICD-10-CM | POA: Diagnosis not present

## 2019-05-19 DIAGNOSIS — R110 Nausea: Secondary | ICD-10-CM | POA: Diagnosis not present

## 2019-05-19 DIAGNOSIS — R161 Splenomegaly, not elsewhere classified: Secondary | ICD-10-CM | POA: Diagnosis not present

## 2019-05-19 DIAGNOSIS — J45909 Unspecified asthma, uncomplicated: Secondary | ICD-10-CM | POA: Diagnosis not present

## 2019-05-19 DIAGNOSIS — E109 Type 1 diabetes mellitus without complications: Secondary | ICD-10-CM | POA: Diagnosis not present

## 2019-05-19 DIAGNOSIS — R0902 Hypoxemia: Secondary | ICD-10-CM | POA: Diagnosis not present

## 2019-05-19 DIAGNOSIS — R52 Pain, unspecified: Secondary | ICD-10-CM | POA: Diagnosis not present

## 2019-05-28 DIAGNOSIS — Z794 Long term (current) use of insulin: Secondary | ICD-10-CM | POA: Diagnosis not present

## 2019-05-28 DIAGNOSIS — K831 Obstruction of bile duct: Secondary | ICD-10-CM | POA: Diagnosis not present

## 2019-05-28 DIAGNOSIS — E109 Type 1 diabetes mellitus without complications: Secondary | ICD-10-CM | POA: Diagnosis not present

## 2019-05-28 DIAGNOSIS — R161 Splenomegaly, not elsewhere classified: Secondary | ICD-10-CM | POA: Diagnosis not present

## 2019-05-30 DIAGNOSIS — Z794 Long term (current) use of insulin: Secondary | ICD-10-CM | POA: Diagnosis not present

## 2019-05-30 DIAGNOSIS — E109 Type 1 diabetes mellitus without complications: Secondary | ICD-10-CM | POA: Diagnosis not present

## 2019-06-11 DIAGNOSIS — R161 Splenomegaly, not elsewhere classified: Secondary | ICD-10-CM | POA: Diagnosis not present

## 2019-06-11 DIAGNOSIS — R590 Localized enlarged lymph nodes: Secondary | ICD-10-CM | POA: Diagnosis not present

## 2019-06-11 DIAGNOSIS — K831 Obstruction of bile duct: Secondary | ICD-10-CM | POA: Diagnosis not present

## 2019-06-11 DIAGNOSIS — Z8719 Personal history of other diseases of the digestive system: Secondary | ICD-10-CM | POA: Diagnosis not present

## 2019-06-11 DIAGNOSIS — Z01818 Encounter for other preprocedural examination: Secondary | ICD-10-CM | POA: Diagnosis not present

## 2019-06-11 DIAGNOSIS — D696 Thrombocytopenia, unspecified: Secondary | ICD-10-CM | POA: Diagnosis not present

## 2019-06-11 DIAGNOSIS — D731 Hypersplenism: Secondary | ICD-10-CM | POA: Diagnosis not present

## 2019-06-11 DIAGNOSIS — E109 Type 1 diabetes mellitus without complications: Secondary | ICD-10-CM | POA: Diagnosis not present

## 2019-06-15 DIAGNOSIS — Z01812 Encounter for preprocedural laboratory examination: Secondary | ICD-10-CM | POA: Diagnosis not present

## 2019-06-15 DIAGNOSIS — K831 Obstruction of bile duct: Secondary | ICD-10-CM | POA: Diagnosis not present

## 2019-06-15 DIAGNOSIS — Z20822 Contact with and (suspected) exposure to covid-19: Secondary | ICD-10-CM | POA: Diagnosis not present

## 2019-06-22 DIAGNOSIS — G8918 Other acute postprocedural pain: Secondary | ICD-10-CM | POA: Diagnosis not present

## 2019-06-22 DIAGNOSIS — D696 Thrombocytopenia, unspecified: Secondary | ICD-10-CM | POA: Diagnosis not present

## 2019-06-22 DIAGNOSIS — E1065 Type 1 diabetes mellitus with hyperglycemia: Secondary | ICD-10-CM | POA: Diagnosis not present

## 2019-06-22 DIAGNOSIS — C240 Malignant neoplasm of extrahepatic bile duct: Secondary | ICD-10-CM | POA: Diagnosis not present

## 2019-06-22 DIAGNOSIS — K7689 Other specified diseases of liver: Secondary | ICD-10-CM | POA: Diagnosis not present

## 2019-06-22 DIAGNOSIS — D731 Hypersplenism: Secondary | ICD-10-CM | POA: Diagnosis not present

## 2019-06-22 DIAGNOSIS — R109 Unspecified abdominal pain: Secondary | ICD-10-CM | POA: Diagnosis not present

## 2019-06-22 DIAGNOSIS — J4520 Mild intermittent asthma, uncomplicated: Secondary | ICD-10-CM | POA: Diagnosis not present

## 2019-06-22 DIAGNOSIS — R161 Splenomegaly, not elsewhere classified: Secondary | ICD-10-CM | POA: Diagnosis not present

## 2019-06-22 DIAGNOSIS — N401 Enlarged prostate with lower urinary tract symptoms: Secondary | ICD-10-CM | POA: Diagnosis not present

## 2019-06-22 DIAGNOSIS — Z833 Family history of diabetes mellitus: Secondary | ICD-10-CM | POA: Diagnosis not present

## 2019-06-22 DIAGNOSIS — D62 Acute posthemorrhagic anemia: Secondary | ICD-10-CM | POA: Diagnosis not present

## 2019-06-22 DIAGNOSIS — E1069 Type 1 diabetes mellitus with other specified complication: Secondary | ICD-10-CM | POA: Diagnosis not present

## 2019-06-22 DIAGNOSIS — R590 Localized enlarged lymph nodes: Secondary | ICD-10-CM | POA: Diagnosis not present

## 2019-06-22 DIAGNOSIS — K660 Peritoneal adhesions (postprocedural) (postinfection): Secondary | ICD-10-CM | POA: Diagnosis not present

## 2019-06-22 DIAGNOSIS — K831 Obstruction of bile duct: Secondary | ICD-10-CM | POA: Diagnosis not present

## 2019-06-22 DIAGNOSIS — Z9641 Presence of insulin pump (external) (internal): Secondary | ICD-10-CM | POA: Diagnosis not present

## 2019-06-22 DIAGNOSIS — Z4659 Encounter for fitting and adjustment of other gastrointestinal appliance and device: Secondary | ICD-10-CM | POA: Diagnosis not present

## 2019-06-22 DIAGNOSIS — Z794 Long term (current) use of insulin: Secondary | ICD-10-CM | POA: Diagnosis not present

## 2019-07-17 DIAGNOSIS — E1040 Type 1 diabetes mellitus with diabetic neuropathy, unspecified: Secondary | ICD-10-CM | POA: Diagnosis not present

## 2019-07-26 DIAGNOSIS — C221 Intrahepatic bile duct carcinoma: Secondary | ICD-10-CM | POA: Diagnosis not present

## 2019-07-26 DIAGNOSIS — C8517 Unspecified B-cell lymphoma, spleen: Secondary | ICD-10-CM | POA: Diagnosis not present

## 2019-07-26 DIAGNOSIS — K831 Obstruction of bile duct: Secondary | ICD-10-CM | POA: Diagnosis not present

## 2019-07-26 DIAGNOSIS — Z90411 Acquired partial absence of pancreas: Secondary | ICD-10-CM | POA: Diagnosis not present

## 2019-07-26 DIAGNOSIS — Z471 Aftercare following joint replacement surgery: Secondary | ICD-10-CM | POA: Diagnosis not present

## 2019-07-31 DIAGNOSIS — Z9889 Other specified postprocedural states: Secondary | ICD-10-CM | POA: Diagnosis not present

## 2019-07-31 DIAGNOSIS — D72820 Lymphocytosis (symptomatic): Secondary | ICD-10-CM | POA: Diagnosis not present

## 2019-07-31 DIAGNOSIS — Z9049 Acquired absence of other specified parts of digestive tract: Secondary | ICD-10-CM | POA: Diagnosis not present

## 2019-07-31 DIAGNOSIS — C221 Intrahepatic bile duct carcinoma: Secondary | ICD-10-CM | POA: Diagnosis not present

## 2019-07-31 DIAGNOSIS — E109 Type 1 diabetes mellitus without complications: Secondary | ICD-10-CM | POA: Diagnosis not present

## 2019-07-31 DIAGNOSIS — C240 Malignant neoplasm of extrahepatic bile duct: Secondary | ICD-10-CM | POA: Diagnosis not present

## 2019-07-31 DIAGNOSIS — D696 Thrombocytopenia, unspecified: Secondary | ICD-10-CM | POA: Diagnosis not present

## 2019-08-08 DIAGNOSIS — Z961 Presence of intraocular lens: Secondary | ICD-10-CM | POA: Diagnosis not present

## 2019-08-08 DIAGNOSIS — H401130 Primary open-angle glaucoma, bilateral, stage unspecified: Secondary | ICD-10-CM | POA: Diagnosis not present

## 2019-08-08 DIAGNOSIS — E119 Type 2 diabetes mellitus without complications: Secondary | ICD-10-CM | POA: Diagnosis not present

## 2019-08-08 DIAGNOSIS — Z794 Long term (current) use of insulin: Secondary | ICD-10-CM | POA: Diagnosis not present

## 2019-08-08 DIAGNOSIS — H35371 Puckering of macula, right eye: Secondary | ICD-10-CM | POA: Diagnosis not present

## 2019-08-09 DIAGNOSIS — C8510 Unspecified B-cell lymphoma, unspecified site: Secondary | ICD-10-CM | POA: Diagnosis not present

## 2019-08-09 DIAGNOSIS — D509 Iron deficiency anemia, unspecified: Secondary | ICD-10-CM | POA: Diagnosis not present

## 2019-08-09 DIAGNOSIS — Z5111 Encounter for antineoplastic chemotherapy: Secondary | ICD-10-CM | POA: Diagnosis not present

## 2019-08-09 DIAGNOSIS — C221 Intrahepatic bile duct carcinoma: Secondary | ICD-10-CM | POA: Diagnosis not present

## 2019-08-16 DIAGNOSIS — R590 Localized enlarged lymph nodes: Secondary | ICD-10-CM | POA: Diagnosis not present

## 2019-08-16 DIAGNOSIS — R918 Other nonspecific abnormal finding of lung field: Secondary | ICD-10-CM | POA: Diagnosis not present

## 2019-08-16 DIAGNOSIS — C221 Intrahepatic bile duct carcinoma: Secondary | ICD-10-CM | POA: Diagnosis not present

## 2019-08-16 DIAGNOSIS — Z5111 Encounter for antineoplastic chemotherapy: Secondary | ICD-10-CM | POA: Diagnosis not present

## 2019-08-16 DIAGNOSIS — Z79899 Other long term (current) drug therapy: Secondary | ICD-10-CM | POA: Diagnosis not present

## 2019-08-16 DIAGNOSIS — C8510 Unspecified B-cell lymphoma, unspecified site: Secondary | ICD-10-CM | POA: Diagnosis not present

## 2019-08-17 DIAGNOSIS — E109 Type 1 diabetes mellitus without complications: Secondary | ICD-10-CM | POA: Diagnosis not present

## 2019-08-17 DIAGNOSIS — Z794 Long term (current) use of insulin: Secondary | ICD-10-CM | POA: Diagnosis not present

## 2019-08-23 DIAGNOSIS — E109 Type 1 diabetes mellitus without complications: Secondary | ICD-10-CM | POA: Diagnosis not present

## 2019-08-23 DIAGNOSIS — D509 Iron deficiency anemia, unspecified: Secondary | ICD-10-CM | POA: Diagnosis not present

## 2019-08-23 DIAGNOSIS — C8590 Non-Hodgkin lymphoma, unspecified, unspecified site: Secondary | ICD-10-CM | POA: Diagnosis not present

## 2019-08-23 DIAGNOSIS — K219 Gastro-esophageal reflux disease without esophagitis: Secondary | ICD-10-CM | POA: Diagnosis not present

## 2019-08-23 DIAGNOSIS — Z794 Long term (current) use of insulin: Secondary | ICD-10-CM | POA: Diagnosis not present

## 2019-08-23 DIAGNOSIS — Z5111 Encounter for antineoplastic chemotherapy: Secondary | ICD-10-CM | POA: Diagnosis not present

## 2019-08-23 DIAGNOSIS — Z79899 Other long term (current) drug therapy: Secondary | ICD-10-CM | POA: Diagnosis not present

## 2019-08-23 DIAGNOSIS — Z9889 Other specified postprocedural states: Secondary | ICD-10-CM | POA: Diagnosis not present

## 2019-08-23 DIAGNOSIS — C221 Intrahepatic bile duct carcinoma: Secondary | ICD-10-CM | POA: Diagnosis not present

## 2019-09-06 DIAGNOSIS — Z79899 Other long term (current) drug therapy: Secondary | ICD-10-CM | POA: Diagnosis not present

## 2019-09-06 DIAGNOSIS — C221 Intrahepatic bile duct carcinoma: Secondary | ICD-10-CM | POA: Diagnosis not present

## 2019-09-06 DIAGNOSIS — Z5111 Encounter for antineoplastic chemotherapy: Secondary | ICD-10-CM | POA: Diagnosis not present

## 2019-09-06 DIAGNOSIS — C8510 Unspecified B-cell lymphoma, unspecified site: Secondary | ICD-10-CM | POA: Diagnosis not present

## 2019-09-13 DIAGNOSIS — E119 Type 2 diabetes mellitus without complications: Secondary | ICD-10-CM | POA: Diagnosis not present

## 2019-09-13 DIAGNOSIS — Z79899 Other long term (current) drug therapy: Secondary | ICD-10-CM | POA: Diagnosis not present

## 2019-09-13 DIAGNOSIS — K219 Gastro-esophageal reflux disease without esophagitis: Secondary | ICD-10-CM | POA: Diagnosis not present

## 2019-09-13 DIAGNOSIS — C8510 Unspecified B-cell lymphoma, unspecified site: Secondary | ICD-10-CM | POA: Diagnosis not present

## 2019-09-13 DIAGNOSIS — D509 Iron deficiency anemia, unspecified: Secondary | ICD-10-CM | POA: Diagnosis not present

## 2019-09-13 DIAGNOSIS — C221 Intrahepatic bile duct carcinoma: Secondary | ICD-10-CM | POA: Diagnosis not present

## 2019-09-13 DIAGNOSIS — Z794 Long term (current) use of insulin: Secondary | ICD-10-CM | POA: Diagnosis not present

## 2019-09-27 DIAGNOSIS — D509 Iron deficiency anemia, unspecified: Secondary | ICD-10-CM | POA: Diagnosis not present

## 2019-09-27 DIAGNOSIS — E109 Type 1 diabetes mellitus without complications: Secondary | ICD-10-CM | POA: Diagnosis not present

## 2019-09-27 DIAGNOSIS — C8331 Diffuse large B-cell lymphoma, lymph nodes of head, face, and neck: Secondary | ICD-10-CM | POA: Diagnosis not present

## 2019-09-27 DIAGNOSIS — Z79899 Other long term (current) drug therapy: Secondary | ICD-10-CM | POA: Diagnosis not present

## 2019-09-27 DIAGNOSIS — Z9049 Acquired absence of other specified parts of digestive tract: Secondary | ICD-10-CM | POA: Diagnosis not present

## 2019-09-27 DIAGNOSIS — Z794 Long term (current) use of insulin: Secondary | ICD-10-CM | POA: Diagnosis not present

## 2019-09-27 DIAGNOSIS — Z5111 Encounter for antineoplastic chemotherapy: Secondary | ICD-10-CM | POA: Diagnosis not present

## 2019-09-27 DIAGNOSIS — Z9889 Other specified postprocedural states: Secondary | ICD-10-CM | POA: Diagnosis not present

## 2019-09-27 DIAGNOSIS — C221 Intrahepatic bile duct carcinoma: Secondary | ICD-10-CM | POA: Diagnosis not present

## 2019-09-27 DIAGNOSIS — K219 Gastro-esophageal reflux disease without esophagitis: Secondary | ICD-10-CM | POA: Diagnosis not present

## 2019-10-03 DIAGNOSIS — Z79899 Other long term (current) drug therapy: Secondary | ICD-10-CM | POA: Diagnosis not present

## 2019-10-03 DIAGNOSIS — E10319 Type 1 diabetes mellitus with unspecified diabetic retinopathy without macular edema: Secondary | ICD-10-CM | POA: Diagnosis not present

## 2019-10-03 DIAGNOSIS — I10 Essential (primary) hypertension: Secondary | ICD-10-CM | POA: Diagnosis not present

## 2019-10-03 DIAGNOSIS — Z9641 Presence of insulin pump (external) (internal): Secondary | ICD-10-CM | POA: Diagnosis not present

## 2019-10-03 DIAGNOSIS — E1040 Type 1 diabetes mellitus with diabetic neuropathy, unspecified: Secondary | ICD-10-CM | POA: Diagnosis not present

## 2019-10-04 DIAGNOSIS — C221 Intrahepatic bile duct carcinoma: Secondary | ICD-10-CM | POA: Diagnosis not present

## 2019-10-16 DIAGNOSIS — E7800 Pure hypercholesterolemia, unspecified: Secondary | ICD-10-CM | POA: Diagnosis not present

## 2019-10-16 DIAGNOSIS — E1040 Type 1 diabetes mellitus with diabetic neuropathy, unspecified: Secondary | ICD-10-CM | POA: Diagnosis not present

## 2019-10-18 DIAGNOSIS — Z794 Long term (current) use of insulin: Secondary | ICD-10-CM | POA: Diagnosis not present

## 2019-10-18 DIAGNOSIS — Z79899 Other long term (current) drug therapy: Secondary | ICD-10-CM | POA: Diagnosis not present

## 2019-10-18 DIAGNOSIS — K219 Gastro-esophageal reflux disease without esophagitis: Secondary | ICD-10-CM | POA: Diagnosis not present

## 2019-10-18 DIAGNOSIS — Z5111 Encounter for antineoplastic chemotherapy: Secondary | ICD-10-CM | POA: Diagnosis not present

## 2019-10-18 DIAGNOSIS — Z9081 Acquired absence of spleen: Secondary | ICD-10-CM | POA: Diagnosis not present

## 2019-10-18 DIAGNOSIS — Z9889 Other specified postprocedural states: Secondary | ICD-10-CM | POA: Diagnosis not present

## 2019-10-18 DIAGNOSIS — C8331 Diffuse large B-cell lymphoma, lymph nodes of head, face, and neck: Secondary | ICD-10-CM | POA: Diagnosis not present

## 2019-10-18 DIAGNOSIS — D509 Iron deficiency anemia, unspecified: Secondary | ICD-10-CM | POA: Diagnosis not present

## 2019-10-18 DIAGNOSIS — E109 Type 1 diabetes mellitus without complications: Secondary | ICD-10-CM | POA: Diagnosis not present

## 2019-10-18 DIAGNOSIS — C221 Intrahepatic bile duct carcinoma: Secondary | ICD-10-CM | POA: Diagnosis not present

## 2019-10-22 DIAGNOSIS — E109 Type 1 diabetes mellitus without complications: Secondary | ICD-10-CM | POA: Diagnosis not present

## 2019-10-22 DIAGNOSIS — Z794 Long term (current) use of insulin: Secondary | ICD-10-CM | POA: Diagnosis not present

## 2019-10-23 DIAGNOSIS — Z0000 Encounter for general adult medical examination without abnormal findings: Secondary | ICD-10-CM | POA: Diagnosis not present

## 2019-10-23 DIAGNOSIS — E10319 Type 1 diabetes mellitus with unspecified diabetic retinopathy without macular edema: Secondary | ICD-10-CM | POA: Diagnosis not present

## 2019-10-23 DIAGNOSIS — E1040 Type 1 diabetes mellitus with diabetic neuropathy, unspecified: Secondary | ICD-10-CM | POA: Diagnosis not present

## 2019-10-23 DIAGNOSIS — I10 Essential (primary) hypertension: Secondary | ICD-10-CM | POA: Diagnosis not present

## 2019-10-26 DIAGNOSIS — R590 Localized enlarged lymph nodes: Secondary | ICD-10-CM | POA: Diagnosis not present

## 2019-10-26 DIAGNOSIS — Z90411 Acquired partial absence of pancreas: Secondary | ICD-10-CM | POA: Diagnosis not present

## 2019-10-26 DIAGNOSIS — R599 Enlarged lymph nodes, unspecified: Secondary | ICD-10-CM | POA: Diagnosis not present

## 2019-10-26 DIAGNOSIS — C8590 Non-Hodgkin lymphoma, unspecified, unspecified site: Secondary | ICD-10-CM | POA: Diagnosis not present

## 2019-10-26 DIAGNOSIS — Z9049 Acquired absence of other specified parts of digestive tract: Secondary | ICD-10-CM | POA: Diagnosis not present

## 2019-10-26 DIAGNOSIS — Z79899 Other long term (current) drug therapy: Secondary | ICD-10-CM | POA: Diagnosis not present

## 2019-10-26 DIAGNOSIS — Z9889 Other specified postprocedural states: Secondary | ICD-10-CM | POA: Diagnosis not present

## 2019-10-26 DIAGNOSIS — C221 Intrahepatic bile duct carcinoma: Secondary | ICD-10-CM | POA: Diagnosis not present

## 2019-10-26 DIAGNOSIS — C240 Malignant neoplasm of extrahepatic bile duct: Secondary | ICD-10-CM | POA: Diagnosis not present

## 2019-10-26 DIAGNOSIS — K769 Liver disease, unspecified: Secondary | ICD-10-CM | POA: Diagnosis not present

## 2019-10-26 DIAGNOSIS — R161 Splenomegaly, not elsewhere classified: Secondary | ICD-10-CM | POA: Diagnosis not present

## 2019-11-01 DIAGNOSIS — C8510 Unspecified B-cell lymphoma, unspecified site: Secondary | ICD-10-CM | POA: Diagnosis not present

## 2019-11-01 DIAGNOSIS — C221 Intrahepatic bile duct carcinoma: Secondary | ICD-10-CM | POA: Diagnosis not present

## 2019-11-01 DIAGNOSIS — Z794 Long term (current) use of insulin: Secondary | ICD-10-CM | POA: Diagnosis not present

## 2019-11-01 DIAGNOSIS — K219 Gastro-esophageal reflux disease without esophagitis: Secondary | ICD-10-CM | POA: Diagnosis not present

## 2019-11-01 DIAGNOSIS — Z79899 Other long term (current) drug therapy: Secondary | ICD-10-CM | POA: Diagnosis not present

## 2019-11-01 DIAGNOSIS — D509 Iron deficiency anemia, unspecified: Secondary | ICD-10-CM | POA: Diagnosis not present

## 2019-11-01 DIAGNOSIS — E119 Type 2 diabetes mellitus without complications: Secondary | ICD-10-CM | POA: Diagnosis not present

## 2019-11-03 DIAGNOSIS — C221 Intrahepatic bile duct carcinoma: Secondary | ICD-10-CM | POA: Diagnosis not present

## 2019-11-08 DIAGNOSIS — Z90411 Acquired partial absence of pancreas: Secondary | ICD-10-CM | POA: Diagnosis not present

## 2019-11-08 DIAGNOSIS — Z9641 Presence of insulin pump (external) (internal): Secondary | ICD-10-CM | POA: Diagnosis not present

## 2019-11-08 DIAGNOSIS — Z9081 Acquired absence of spleen: Secondary | ICD-10-CM | POA: Diagnosis not present

## 2019-11-08 DIAGNOSIS — Z9049 Acquired absence of other specified parts of digestive tract: Secondary | ICD-10-CM | POA: Diagnosis not present

## 2019-11-08 DIAGNOSIS — E109 Type 1 diabetes mellitus without complications: Secondary | ICD-10-CM | POA: Diagnosis not present

## 2019-11-08 DIAGNOSIS — Z79899 Other long term (current) drug therapy: Secondary | ICD-10-CM | POA: Diagnosis not present

## 2019-11-08 DIAGNOSIS — C8510 Unspecified B-cell lymphoma, unspecified site: Secondary | ICD-10-CM | POA: Diagnosis not present

## 2019-11-08 DIAGNOSIS — C221 Intrahepatic bile duct carcinoma: Secondary | ICD-10-CM | POA: Diagnosis not present

## 2019-11-13 DIAGNOSIS — C221 Intrahepatic bile duct carcinoma: Secondary | ICD-10-CM | POA: Diagnosis not present

## 2019-11-13 DIAGNOSIS — E109 Type 1 diabetes mellitus without complications: Secondary | ICD-10-CM | POA: Diagnosis not present

## 2019-11-13 DIAGNOSIS — Z90411 Acquired partial absence of pancreas: Secondary | ICD-10-CM | POA: Diagnosis not present

## 2019-11-13 DIAGNOSIS — C240 Malignant neoplasm of extrahepatic bile duct: Secondary | ICD-10-CM | POA: Diagnosis not present

## 2019-11-13 DIAGNOSIS — Z9081 Acquired absence of spleen: Secondary | ICD-10-CM | POA: Diagnosis not present

## 2019-11-13 DIAGNOSIS — C8510 Unspecified B-cell lymphoma, unspecified site: Secondary | ICD-10-CM | POA: Diagnosis not present

## 2019-11-13 DIAGNOSIS — Z9049 Acquired absence of other specified parts of digestive tract: Secondary | ICD-10-CM | POA: Diagnosis not present

## 2019-11-13 DIAGNOSIS — Z79899 Other long term (current) drug therapy: Secondary | ICD-10-CM | POA: Diagnosis not present

## 2019-11-13 DIAGNOSIS — Z9641 Presence of insulin pump (external) (internal): Secondary | ICD-10-CM | POA: Diagnosis not present

## 2019-11-16 DIAGNOSIS — Z23 Encounter for immunization: Secondary | ICD-10-CM | POA: Diagnosis not present

## 2019-11-20 DIAGNOSIS — Z0120 Encounter for dental examination and cleaning without abnormal findings: Secondary | ICD-10-CM | POA: Diagnosis not present

## 2019-11-22 DIAGNOSIS — Z90411 Acquired partial absence of pancreas: Secondary | ICD-10-CM | POA: Diagnosis not present

## 2019-11-22 DIAGNOSIS — C221 Intrahepatic bile duct carcinoma: Secondary | ICD-10-CM | POA: Diagnosis not present

## 2019-11-22 DIAGNOSIS — Z9081 Acquired absence of spleen: Secondary | ICD-10-CM | POA: Diagnosis not present

## 2019-11-22 DIAGNOSIS — R161 Splenomegaly, not elsewhere classified: Secondary | ICD-10-CM | POA: Diagnosis not present

## 2019-11-22 DIAGNOSIS — C772 Secondary and unspecified malignant neoplasm of intra-abdominal lymph nodes: Secondary | ICD-10-CM | POA: Diagnosis not present

## 2019-11-22 DIAGNOSIS — D696 Thrombocytopenia, unspecified: Secondary | ICD-10-CM | POA: Diagnosis not present

## 2019-11-27 DIAGNOSIS — Z794 Long term (current) use of insulin: Secondary | ICD-10-CM | POA: Diagnosis not present

## 2019-11-27 DIAGNOSIS — E109 Type 1 diabetes mellitus without complications: Secondary | ICD-10-CM | POA: Diagnosis not present

## 2020-01-11 DIAGNOSIS — E1040 Type 1 diabetes mellitus with diabetic neuropathy, unspecified: Secondary | ICD-10-CM | POA: Diagnosis not present

## 2020-01-15 DIAGNOSIS — E1040 Type 1 diabetes mellitus with diabetic neuropathy, unspecified: Secondary | ICD-10-CM | POA: Diagnosis not present

## 2020-01-23 DIAGNOSIS — E10319 Type 1 diabetes mellitus with unspecified diabetic retinopathy without macular edema: Secondary | ICD-10-CM | POA: Diagnosis not present

## 2020-01-23 DIAGNOSIS — R972 Elevated prostate specific antigen [PSA]: Secondary | ICD-10-CM | POA: Diagnosis not present

## 2020-01-23 DIAGNOSIS — E1040 Type 1 diabetes mellitus with diabetic neuropathy, unspecified: Secondary | ICD-10-CM | POA: Diagnosis not present

## 2020-01-23 DIAGNOSIS — I10 Essential (primary) hypertension: Secondary | ICD-10-CM | POA: Diagnosis not present

## 2020-02-04 DIAGNOSIS — R590 Localized enlarged lymph nodes: Secondary | ICD-10-CM | POA: Diagnosis not present

## 2020-02-04 DIAGNOSIS — R978 Other abnormal tumor markers: Secondary | ICD-10-CM | POA: Diagnosis not present

## 2020-02-04 DIAGNOSIS — R161 Splenomegaly, not elsewhere classified: Secondary | ICD-10-CM | POA: Diagnosis not present

## 2020-02-04 DIAGNOSIS — Z8572 Personal history of non-Hodgkin lymphomas: Secondary | ICD-10-CM | POA: Diagnosis not present

## 2020-02-04 DIAGNOSIS — C221 Intrahepatic bile duct carcinoma: Secondary | ICD-10-CM | POA: Diagnosis not present

## 2020-02-04 DIAGNOSIS — K769 Liver disease, unspecified: Secondary | ICD-10-CM | POA: Diagnosis not present

## 2020-02-06 DIAGNOSIS — Z794 Long term (current) use of insulin: Secondary | ICD-10-CM | POA: Diagnosis not present

## 2020-02-06 DIAGNOSIS — E109 Type 1 diabetes mellitus without complications: Secondary | ICD-10-CM | POA: Diagnosis not present

## 2020-02-07 DIAGNOSIS — K219 Gastro-esophageal reflux disease without esophagitis: Secondary | ICD-10-CM | POA: Diagnosis not present

## 2020-02-07 DIAGNOSIS — C221 Intrahepatic bile duct carcinoma: Secondary | ICD-10-CM | POA: Diagnosis not present

## 2020-02-07 DIAGNOSIS — Z79899 Other long term (current) drug therapy: Secondary | ICD-10-CM | POA: Diagnosis not present

## 2020-02-07 DIAGNOSIS — C8510 Unspecified B-cell lymphoma, unspecified site: Secondary | ICD-10-CM | POA: Diagnosis not present

## 2020-02-07 DIAGNOSIS — Z9049 Acquired absence of other specified parts of digestive tract: Secondary | ICD-10-CM | POA: Diagnosis not present

## 2020-02-07 DIAGNOSIS — D72820 Lymphocytosis (symptomatic): Secondary | ICD-10-CM | POA: Diagnosis not present

## 2020-02-07 DIAGNOSIS — Z794 Long term (current) use of insulin: Secondary | ICD-10-CM | POA: Diagnosis not present

## 2020-02-07 DIAGNOSIS — C8590 Non-Hodgkin lymphoma, unspecified, unspecified site: Secondary | ICD-10-CM | POA: Diagnosis not present

## 2020-02-07 DIAGNOSIS — E109 Type 1 diabetes mellitus without complications: Secondary | ICD-10-CM | POA: Diagnosis not present

## 2020-02-26 DIAGNOSIS — E109 Type 1 diabetes mellitus without complications: Secondary | ICD-10-CM | POA: Diagnosis not present

## 2020-02-26 DIAGNOSIS — Z794 Long term (current) use of insulin: Secondary | ICD-10-CM | POA: Diagnosis not present

## 2020-03-03 DIAGNOSIS — J069 Acute upper respiratory infection, unspecified: Secondary | ICD-10-CM | POA: Diagnosis not present

## 2020-03-03 DIAGNOSIS — J019 Acute sinusitis, unspecified: Secondary | ICD-10-CM | POA: Diagnosis not present

## 2020-03-10 DIAGNOSIS — C8590 Non-Hodgkin lymphoma, unspecified, unspecified site: Secondary | ICD-10-CM | POA: Diagnosis not present

## 2020-03-10 DIAGNOSIS — C221 Intrahepatic bile duct carcinoma: Secondary | ICD-10-CM | POA: Diagnosis not present

## 2020-03-10 DIAGNOSIS — D696 Thrombocytopenia, unspecified: Secondary | ICD-10-CM | POA: Diagnosis not present

## 2020-03-19 DIAGNOSIS — C240 Malignant neoplasm of extrahepatic bile duct: Secondary | ICD-10-CM | POA: Diagnosis not present

## 2020-03-19 DIAGNOSIS — C8593 Non-Hodgkin lymphoma, unspecified, intra-abdominal lymph nodes: Secondary | ICD-10-CM | POA: Diagnosis not present

## 2020-03-19 DIAGNOSIS — R161 Splenomegaly, not elsewhere classified: Secondary | ICD-10-CM | POA: Diagnosis not present

## 2020-03-19 DIAGNOSIS — C8519 Unspecified B-cell lymphoma, extranodal and solid organ sites: Secondary | ICD-10-CM | POA: Diagnosis not present

## 2020-03-20 DIAGNOSIS — C8519 Unspecified B-cell lymphoma, extranodal and solid organ sites: Secondary | ICD-10-CM | POA: Diagnosis not present

## 2020-03-20 DIAGNOSIS — C8510 Unspecified B-cell lymphoma, unspecified site: Secondary | ICD-10-CM | POA: Diagnosis not present

## 2020-04-02 DIAGNOSIS — H11003 Unspecified pterygium of eye, bilateral: Secondary | ICD-10-CM | POA: Diagnosis not present

## 2020-04-02 DIAGNOSIS — E119 Type 2 diabetes mellitus without complications: Secondary | ICD-10-CM | POA: Diagnosis not present

## 2020-04-02 DIAGNOSIS — H5213 Myopia, bilateral: Secondary | ICD-10-CM | POA: Diagnosis not present

## 2020-04-02 DIAGNOSIS — H524 Presbyopia: Secondary | ICD-10-CM | POA: Diagnosis not present

## 2020-04-02 DIAGNOSIS — Z794 Long term (current) use of insulin: Secondary | ICD-10-CM | POA: Diagnosis not present

## 2020-04-02 DIAGNOSIS — H52203 Unspecified astigmatism, bilateral: Secondary | ICD-10-CM | POA: Diagnosis not present

## 2020-04-02 DIAGNOSIS — Z961 Presence of intraocular lens: Secondary | ICD-10-CM | POA: Diagnosis not present

## 2020-04-02 DIAGNOSIS — H35371 Puckering of macula, right eye: Secondary | ICD-10-CM | POA: Diagnosis not present

## 2020-04-02 DIAGNOSIS — H401131 Primary open-angle glaucoma, bilateral, mild stage: Secondary | ICD-10-CM | POA: Diagnosis not present

## 2020-04-09 DIAGNOSIS — E1040 Type 1 diabetes mellitus with diabetic neuropathy, unspecified: Secondary | ICD-10-CM | POA: Diagnosis not present

## 2020-04-14 DIAGNOSIS — C8307 Small cell B-cell lymphoma, spleen: Secondary | ICD-10-CM | POA: Diagnosis not present

## 2020-04-14 DIAGNOSIS — C8583 Other specified types of non-Hodgkin lymphoma, intra-abdominal lymph nodes: Secondary | ICD-10-CM | POA: Diagnosis not present

## 2020-04-21 DIAGNOSIS — E109 Type 1 diabetes mellitus without complications: Secondary | ICD-10-CM | POA: Diagnosis not present

## 2020-04-21 DIAGNOSIS — Z794 Long term (current) use of insulin: Secondary | ICD-10-CM | POA: Diagnosis not present

## 2020-04-24 DIAGNOSIS — E1040 Type 1 diabetes mellitus with diabetic neuropathy, unspecified: Secondary | ICD-10-CM | POA: Diagnosis not present

## 2020-05-22 DIAGNOSIS — E1065 Type 1 diabetes mellitus with hyperglycemia: Secondary | ICD-10-CM | POA: Diagnosis not present

## 2020-05-22 DIAGNOSIS — C221 Intrahepatic bile duct carcinoma: Secondary | ICD-10-CM | POA: Diagnosis not present

## 2020-05-22 DIAGNOSIS — R161 Splenomegaly, not elsewhere classified: Secondary | ICD-10-CM | POA: Diagnosis not present

## 2020-05-29 DIAGNOSIS — E109 Type 1 diabetes mellitus without complications: Secondary | ICD-10-CM | POA: Diagnosis not present

## 2020-06-28 DIAGNOSIS — E109 Type 1 diabetes mellitus without complications: Secondary | ICD-10-CM | POA: Diagnosis not present

## 2020-07-03 DIAGNOSIS — E1040 Type 1 diabetes mellitus with diabetic neuropathy, unspecified: Secondary | ICD-10-CM | POA: Diagnosis not present

## 2020-07-22 DIAGNOSIS — E1040 Type 1 diabetes mellitus with diabetic neuropathy, unspecified: Secondary | ICD-10-CM | POA: Diagnosis not present

## 2020-07-23 DIAGNOSIS — E1040 Type 1 diabetes mellitus with diabetic neuropathy, unspecified: Secondary | ICD-10-CM | POA: Diagnosis not present

## 2020-07-28 DIAGNOSIS — E109 Type 1 diabetes mellitus without complications: Secondary | ICD-10-CM | POA: Diagnosis not present

## 2020-08-04 DIAGNOSIS — Z90411 Acquired partial absence of pancreas: Secondary | ICD-10-CM | POA: Diagnosis not present

## 2020-08-04 DIAGNOSIS — C221 Intrahepatic bile duct carcinoma: Secondary | ICD-10-CM | POA: Diagnosis not present

## 2020-08-04 DIAGNOSIS — Z9049 Acquired absence of other specified parts of digestive tract: Secondary | ICD-10-CM | POA: Diagnosis not present

## 2020-08-04 DIAGNOSIS — R590 Localized enlarged lymph nodes: Secondary | ICD-10-CM | POA: Diagnosis not present

## 2020-08-04 DIAGNOSIS — R161 Splenomegaly, not elsewhere classified: Secondary | ICD-10-CM | POA: Diagnosis not present

## 2020-08-07 DIAGNOSIS — Z9081 Acquired absence of spleen: Secondary | ICD-10-CM | POA: Diagnosis not present

## 2020-08-07 DIAGNOSIS — C221 Intrahepatic bile duct carcinoma: Secondary | ICD-10-CM | POA: Diagnosis not present

## 2020-08-07 DIAGNOSIS — Z808 Family history of malignant neoplasm of other organs or systems: Secondary | ICD-10-CM | POA: Diagnosis not present

## 2020-08-07 DIAGNOSIS — D649 Anemia, unspecified: Secondary | ICD-10-CM | POA: Diagnosis not present

## 2020-08-07 DIAGNOSIS — E109 Type 1 diabetes mellitus without complications: Secondary | ICD-10-CM | POA: Diagnosis not present

## 2020-08-07 DIAGNOSIS — Z9221 Personal history of antineoplastic chemotherapy: Secondary | ICD-10-CM | POA: Diagnosis not present

## 2020-08-07 DIAGNOSIS — C8300 Small cell B-cell lymphoma, unspecified site: Secondary | ICD-10-CM | POA: Diagnosis not present

## 2020-08-07 DIAGNOSIS — D72819 Decreased white blood cell count, unspecified: Secondary | ICD-10-CM | POA: Diagnosis not present

## 2020-08-07 DIAGNOSIS — Z9641 Presence of insulin pump (external) (internal): Secondary | ICD-10-CM | POA: Diagnosis not present

## 2020-08-07 DIAGNOSIS — K219 Gastro-esophageal reflux disease without esophagitis: Secondary | ICD-10-CM | POA: Diagnosis not present

## 2020-08-07 DIAGNOSIS — C240 Malignant neoplasm of extrahepatic bile duct: Secondary | ICD-10-CM | POA: Diagnosis not present

## 2020-08-07 DIAGNOSIS — R161 Splenomegaly, not elsewhere classified: Secondary | ICD-10-CM | POA: Diagnosis not present

## 2020-08-07 DIAGNOSIS — C8307 Small cell B-cell lymphoma, spleen: Secondary | ICD-10-CM | POA: Diagnosis not present

## 2020-08-07 DIAGNOSIS — E1069 Type 1 diabetes mellitus with other specified complication: Secondary | ICD-10-CM | POA: Diagnosis not present

## 2020-08-07 DIAGNOSIS — D696 Thrombocytopenia, unspecified: Secondary | ICD-10-CM | POA: Diagnosis not present

## 2020-08-07 DIAGNOSIS — Z9049 Acquired absence of other specified parts of digestive tract: Secondary | ICD-10-CM | POA: Diagnosis not present

## 2020-08-13 DIAGNOSIS — E109 Type 1 diabetes mellitus without complications: Secondary | ICD-10-CM | POA: Diagnosis not present

## 2020-08-13 DIAGNOSIS — E1140 Type 2 diabetes mellitus with diabetic neuropathy, unspecified: Secondary | ICD-10-CM | POA: Diagnosis not present

## 2020-09-10 DIAGNOSIS — H35371 Puckering of macula, right eye: Secondary | ICD-10-CM | POA: Diagnosis not present

## 2020-09-10 DIAGNOSIS — H40053 Ocular hypertension, bilateral: Secondary | ICD-10-CM | POA: Diagnosis not present

## 2020-09-10 DIAGNOSIS — Z79899 Other long term (current) drug therapy: Secondary | ICD-10-CM | POA: Diagnosis not present

## 2020-09-10 DIAGNOSIS — H11003 Unspecified pterygium of eye, bilateral: Secondary | ICD-10-CM | POA: Diagnosis not present

## 2020-09-10 DIAGNOSIS — E119 Type 2 diabetes mellitus without complications: Secondary | ICD-10-CM | POA: Diagnosis not present

## 2020-09-10 DIAGNOSIS — H401131 Primary open-angle glaucoma, bilateral, mild stage: Secondary | ICD-10-CM | POA: Diagnosis not present

## 2020-09-10 DIAGNOSIS — Z961 Presence of intraocular lens: Secondary | ICD-10-CM | POA: Diagnosis not present

## 2020-09-10 DIAGNOSIS — Z794 Long term (current) use of insulin: Secondary | ICD-10-CM | POA: Diagnosis not present

## 2020-10-02 DIAGNOSIS — E1040 Type 1 diabetes mellitus with diabetic neuropathy, unspecified: Secondary | ICD-10-CM | POA: Diagnosis not present

## 2020-11-07 DIAGNOSIS — E1140 Type 2 diabetes mellitus with diabetic neuropathy, unspecified: Secondary | ICD-10-CM | POA: Diagnosis not present

## 2020-11-11 DIAGNOSIS — E1140 Type 2 diabetes mellitus with diabetic neuropathy, unspecified: Secondary | ICD-10-CM | POA: Diagnosis not present

## 2020-11-24 DIAGNOSIS — R161 Splenomegaly, not elsewhere classified: Secondary | ICD-10-CM | POA: Diagnosis not present

## 2020-11-24 DIAGNOSIS — C221 Intrahepatic bile duct carcinoma: Secondary | ICD-10-CM | POA: Diagnosis not present

## 2020-11-24 DIAGNOSIS — E109 Type 1 diabetes mellitus without complications: Secondary | ICD-10-CM | POA: Diagnosis not present

## 2020-11-24 DIAGNOSIS — Z8572 Personal history of non-Hodgkin lymphomas: Secondary | ICD-10-CM | POA: Diagnosis not present

## 2020-11-28 DIAGNOSIS — I10 Essential (primary) hypertension: Secondary | ICD-10-CM | POA: Diagnosis not present

## 2020-11-28 DIAGNOSIS — E7800 Pure hypercholesterolemia, unspecified: Secondary | ICD-10-CM | POA: Diagnosis not present

## 2020-11-28 DIAGNOSIS — E1040 Type 1 diabetes mellitus with diabetic neuropathy, unspecified: Secondary | ICD-10-CM | POA: Diagnosis not present

## 2020-12-08 DIAGNOSIS — N1339 Other hydronephrosis: Secondary | ICD-10-CM | POA: Diagnosis not present

## 2020-12-08 DIAGNOSIS — N138 Other obstructive and reflux uropathy: Secondary | ICD-10-CM | POA: Diagnosis not present

## 2020-12-08 DIAGNOSIS — N401 Enlarged prostate with lower urinary tract symptoms: Secondary | ICD-10-CM | POA: Diagnosis not present

## 2020-12-08 DIAGNOSIS — R972 Elevated prostate specific antigen [PSA]: Secondary | ICD-10-CM | POA: Diagnosis not present

## 2020-12-24 DIAGNOSIS — E7801 Familial hypercholesterolemia: Secondary | ICD-10-CM | POA: Diagnosis not present

## 2020-12-24 DIAGNOSIS — Z0000 Encounter for general adult medical examination without abnormal findings: Secondary | ICD-10-CM | POA: Diagnosis not present

## 2020-12-24 DIAGNOSIS — Z125 Encounter for screening for malignant neoplasm of prostate: Secondary | ICD-10-CM | POA: Diagnosis not present

## 2020-12-24 DIAGNOSIS — E1040 Type 1 diabetes mellitus with diabetic neuropathy, unspecified: Secondary | ICD-10-CM | POA: Diagnosis not present

## 2020-12-31 DIAGNOSIS — I10 Essential (primary) hypertension: Secondary | ICD-10-CM | POA: Diagnosis not present

## 2020-12-31 DIAGNOSIS — E10319 Type 1 diabetes mellitus with unspecified diabetic retinopathy without macular edema: Secondary | ICD-10-CM | POA: Diagnosis not present

## 2020-12-31 DIAGNOSIS — Z0000 Encounter for general adult medical examination without abnormal findings: Secondary | ICD-10-CM | POA: Diagnosis not present

## 2020-12-31 DIAGNOSIS — D696 Thrombocytopenia, unspecified: Secondary | ICD-10-CM | POA: Diagnosis not present

## 2021-01-30 DIAGNOSIS — E1140 Type 2 diabetes mellitus with diabetic neuropathy, unspecified: Secondary | ICD-10-CM | POA: Diagnosis not present

## 2021-02-02 DIAGNOSIS — K839 Disease of biliary tract, unspecified: Secondary | ICD-10-CM | POA: Diagnosis not present

## 2021-02-02 DIAGNOSIS — Z08 Encounter for follow-up examination after completed treatment for malignant neoplasm: Secondary | ICD-10-CM | POA: Diagnosis not present

## 2021-02-02 DIAGNOSIS — R161 Splenomegaly, not elsewhere classified: Secondary | ICD-10-CM | POA: Diagnosis not present

## 2021-02-02 DIAGNOSIS — Z8509 Personal history of malignant neoplasm of other digestive organs: Secondary | ICD-10-CM | POA: Diagnosis not present

## 2021-02-02 DIAGNOSIS — R591 Generalized enlarged lymph nodes: Secondary | ICD-10-CM | POA: Diagnosis not present

## 2021-02-02 DIAGNOSIS — C221 Intrahepatic bile duct carcinoma: Secondary | ICD-10-CM | POA: Diagnosis not present

## 2021-02-02 DIAGNOSIS — Z8572 Personal history of non-Hodgkin lymphomas: Secondary | ICD-10-CM | POA: Diagnosis not present

## 2021-02-02 DIAGNOSIS — Z90411 Acquired partial absence of pancreas: Secondary | ICD-10-CM | POA: Diagnosis not present

## 2021-02-05 DIAGNOSIS — C8583 Other specified types of non-Hodgkin lymphoma, intra-abdominal lymph nodes: Secondary | ICD-10-CM | POA: Diagnosis not present

## 2021-02-05 DIAGNOSIS — R161 Splenomegaly, not elsewhere classified: Secondary | ICD-10-CM | POA: Diagnosis not present

## 2021-02-05 DIAGNOSIS — C8307 Small cell B-cell lymphoma, spleen: Secondary | ICD-10-CM | POA: Diagnosis not present

## 2021-02-05 DIAGNOSIS — D61818 Other pancytopenia: Secondary | ICD-10-CM | POA: Diagnosis not present

## 2021-02-12 DIAGNOSIS — Z794 Long term (current) use of insulin: Secondary | ICD-10-CM | POA: Diagnosis not present

## 2021-02-12 DIAGNOSIS — C221 Intrahepatic bile duct carcinoma: Secondary | ICD-10-CM | POA: Diagnosis not present

## 2021-02-12 DIAGNOSIS — C23 Malignant neoplasm of gallbladder: Secondary | ICD-10-CM | POA: Diagnosis not present

## 2021-02-12 DIAGNOSIS — C8510 Unspecified B-cell lymphoma, unspecified site: Secondary | ICD-10-CM | POA: Diagnosis not present

## 2021-02-12 DIAGNOSIS — C240 Malignant neoplasm of extrahepatic bile duct: Secondary | ICD-10-CM | POA: Diagnosis not present

## 2021-02-12 DIAGNOSIS — D649 Anemia, unspecified: Secondary | ICD-10-CM | POA: Diagnosis not present

## 2021-02-12 DIAGNOSIS — D72819 Decreased white blood cell count, unspecified: Secondary | ICD-10-CM | POA: Diagnosis not present

## 2021-02-12 DIAGNOSIS — E109 Type 1 diabetes mellitus without complications: Secondary | ICD-10-CM | POA: Diagnosis not present

## 2021-02-25 DIAGNOSIS — C8307 Small cell B-cell lymphoma, spleen: Secondary | ICD-10-CM | POA: Diagnosis not present

## 2021-02-25 DIAGNOSIS — C240 Malignant neoplasm of extrahepatic bile duct: Secondary | ICD-10-CM | POA: Diagnosis not present

## 2021-02-25 DIAGNOSIS — C221 Intrahepatic bile duct carcinoma: Secondary | ICD-10-CM | POA: Diagnosis not present

## 2021-02-25 DIAGNOSIS — C8510 Unspecified B-cell lymphoma, unspecified site: Secondary | ICD-10-CM | POA: Diagnosis not present

## 2021-02-25 DIAGNOSIS — R590 Localized enlarged lymph nodes: Secondary | ICD-10-CM | POA: Diagnosis not present

## 2021-03-02 DIAGNOSIS — R590 Localized enlarged lymph nodes: Secondary | ICD-10-CM | POA: Diagnosis not present

## 2021-03-02 DIAGNOSIS — R162 Hepatomegaly with splenomegaly, not elsewhere classified: Secondary | ICD-10-CM | POA: Diagnosis not present

## 2021-03-02 DIAGNOSIS — R161 Splenomegaly, not elsewhere classified: Secondary | ICD-10-CM | POA: Diagnosis not present

## 2021-03-02 DIAGNOSIS — Z87442 Personal history of urinary calculi: Secondary | ICD-10-CM | POA: Diagnosis not present

## 2021-03-02 DIAGNOSIS — R160 Hepatomegaly, not elsewhere classified: Secondary | ICD-10-CM | POA: Diagnosis not present

## 2021-03-02 DIAGNOSIS — N401 Enlarged prostate with lower urinary tract symptoms: Secondary | ICD-10-CM | POA: Diagnosis not present

## 2021-03-02 DIAGNOSIS — N201 Calculus of ureter: Secondary | ICD-10-CM | POA: Diagnosis not present

## 2021-03-02 DIAGNOSIS — Z8572 Personal history of non-Hodgkin lymphomas: Secondary | ICD-10-CM | POA: Diagnosis not present

## 2021-03-02 DIAGNOSIS — R100 Acute abdomen: Secondary | ICD-10-CM | POA: Diagnosis not present

## 2021-03-02 DIAGNOSIS — N202 Calculus of kidney with calculus of ureter: Secondary | ICD-10-CM | POA: Diagnosis not present

## 2021-03-02 DIAGNOSIS — N132 Hydronephrosis with renal and ureteral calculous obstruction: Secondary | ICD-10-CM | POA: Diagnosis not present

## 2021-03-02 DIAGNOSIS — R109 Unspecified abdominal pain: Secondary | ICD-10-CM | POA: Diagnosis not present

## 2021-03-02 DIAGNOSIS — C221 Intrahepatic bile duct carcinoma: Secondary | ICD-10-CM | POA: Diagnosis not present

## 2021-03-02 DIAGNOSIS — R1084 Generalized abdominal pain: Secondary | ICD-10-CM | POA: Diagnosis not present

## 2021-03-02 DIAGNOSIS — N1330 Unspecified hydronephrosis: Secondary | ICD-10-CM | POA: Diagnosis not present

## 2021-03-02 DIAGNOSIS — Z9889 Other specified postprocedural states: Secondary | ICD-10-CM | POA: Diagnosis not present

## 2021-03-03 DIAGNOSIS — Z9641 Presence of insulin pump (external) (internal): Secondary | ICD-10-CM | POA: Diagnosis not present

## 2021-03-03 DIAGNOSIS — N201 Calculus of ureter: Secondary | ICD-10-CM | POA: Diagnosis not present

## 2021-03-03 DIAGNOSIS — E1042 Type 1 diabetes mellitus with diabetic polyneuropathy: Secondary | ICD-10-CM | POA: Diagnosis not present

## 2021-03-04 DIAGNOSIS — Z9641 Presence of insulin pump (external) (internal): Secondary | ICD-10-CM | POA: Diagnosis not present

## 2021-03-04 DIAGNOSIS — E1042 Type 1 diabetes mellitus with diabetic polyneuropathy: Secondary | ICD-10-CM | POA: Diagnosis not present

## 2021-03-20 DIAGNOSIS — N401 Enlarged prostate with lower urinary tract symptoms: Secondary | ICD-10-CM | POA: Diagnosis not present

## 2021-03-20 DIAGNOSIS — N138 Other obstructive and reflux uropathy: Secondary | ICD-10-CM | POA: Diagnosis not present

## 2021-03-20 DIAGNOSIS — N201 Calculus of ureter: Secondary | ICD-10-CM | POA: Diagnosis not present

## 2021-03-31 DIAGNOSIS — I10 Essential (primary) hypertension: Secondary | ICD-10-CM | POA: Diagnosis not present

## 2021-03-31 DIAGNOSIS — E7800 Pure hypercholesterolemia, unspecified: Secondary | ICD-10-CM | POA: Diagnosis not present

## 2021-03-31 DIAGNOSIS — E1040 Type 1 diabetes mellitus with diabetic neuropathy, unspecified: Secondary | ICD-10-CM | POA: Diagnosis not present

## 2021-04-01 DIAGNOSIS — E1040 Type 1 diabetes mellitus with diabetic neuropathy, unspecified: Secondary | ICD-10-CM | POA: Diagnosis not present

## 2021-04-02 DIAGNOSIS — I498 Other specified cardiac arrhythmias: Secondary | ICD-10-CM | POA: Diagnosis not present

## 2021-04-02 DIAGNOSIS — Z01812 Encounter for preprocedural laboratory examination: Secondary | ICD-10-CM | POA: Diagnosis not present

## 2021-04-02 DIAGNOSIS — Z01810 Encounter for preprocedural cardiovascular examination: Secondary | ICD-10-CM | POA: Diagnosis not present

## 2021-04-02 DIAGNOSIS — N401 Enlarged prostate with lower urinary tract symptoms: Secondary | ICD-10-CM | POA: Diagnosis not present

## 2021-04-02 DIAGNOSIS — N138 Other obstructive and reflux uropathy: Secondary | ICD-10-CM | POA: Diagnosis not present

## 2021-04-02 DIAGNOSIS — N201 Calculus of ureter: Secondary | ICD-10-CM | POA: Diagnosis not present

## 2021-04-06 DIAGNOSIS — E1040 Type 1 diabetes mellitus with diabetic neuropathy, unspecified: Secondary | ICD-10-CM | POA: Diagnosis not present

## 2021-04-06 DIAGNOSIS — I10 Essential (primary) hypertension: Secondary | ICD-10-CM | POA: Diagnosis not present

## 2021-04-07 DIAGNOSIS — N2882 Megaloureter: Secondary | ICD-10-CM | POA: Diagnosis not present

## 2021-04-07 DIAGNOSIS — Z79899 Other long term (current) drug therapy: Secondary | ICD-10-CM | POA: Diagnosis not present

## 2021-04-07 DIAGNOSIS — I10 Essential (primary) hypertension: Secondary | ICD-10-CM | POA: Diagnosis not present

## 2021-04-07 DIAGNOSIS — N400 Enlarged prostate without lower urinary tract symptoms: Secondary | ICD-10-CM | POA: Diagnosis not present

## 2021-04-07 DIAGNOSIS — Z794 Long term (current) use of insulin: Secondary | ICD-10-CM | POA: Diagnosis not present

## 2021-04-07 DIAGNOSIS — N201 Calculus of ureter: Secondary | ICD-10-CM | POA: Diagnosis not present

## 2021-04-07 DIAGNOSIS — Z466 Encounter for fitting and adjustment of urinary device: Secondary | ICD-10-CM | POA: Diagnosis not present

## 2021-04-07 DIAGNOSIS — E119 Type 2 diabetes mellitus without complications: Secondary | ICD-10-CM | POA: Diagnosis not present

## 2021-04-07 DIAGNOSIS — N132 Hydronephrosis with renal and ureteral calculous obstruction: Secondary | ICD-10-CM | POA: Diagnosis not present

## 2021-04-09 DIAGNOSIS — E1040 Type 1 diabetes mellitus with diabetic neuropathy, unspecified: Secondary | ICD-10-CM | POA: Diagnosis not present

## 2021-04-27 DIAGNOSIS — Z960 Presence of urogenital implants: Secondary | ICD-10-CM | POA: Diagnosis not present

## 2021-05-07 DIAGNOSIS — E109 Type 1 diabetes mellitus without complications: Secondary | ICD-10-CM | POA: Diagnosis not present

## 2021-05-07 DIAGNOSIS — Z794 Long term (current) use of insulin: Secondary | ICD-10-CM | POA: Diagnosis not present

## 2021-05-11 DIAGNOSIS — H11003 Unspecified pterygium of eye, bilateral: Secondary | ICD-10-CM | POA: Diagnosis not present

## 2021-05-11 DIAGNOSIS — E109 Type 1 diabetes mellitus without complications: Secondary | ICD-10-CM | POA: Diagnosis not present

## 2021-05-11 DIAGNOSIS — E119 Type 2 diabetes mellitus without complications: Secondary | ICD-10-CM | POA: Diagnosis not present

## 2021-05-11 DIAGNOSIS — H5213 Myopia, bilateral: Secondary | ICD-10-CM | POA: Diagnosis not present

## 2021-05-11 DIAGNOSIS — H35371 Puckering of macula, right eye: Secondary | ICD-10-CM | POA: Diagnosis not present

## 2021-05-11 DIAGNOSIS — H524 Presbyopia: Secondary | ICD-10-CM | POA: Diagnosis not present

## 2021-05-11 DIAGNOSIS — H401131 Primary open-angle glaucoma, bilateral, mild stage: Secondary | ICD-10-CM | POA: Diagnosis not present

## 2021-05-11 DIAGNOSIS — Z961 Presence of intraocular lens: Secondary | ICD-10-CM | POA: Diagnosis not present

## 2021-05-11 DIAGNOSIS — H52203 Unspecified astigmatism, bilateral: Secondary | ICD-10-CM | POA: Diagnosis not present

## 2021-05-11 DIAGNOSIS — Z794 Long term (current) use of insulin: Secondary | ICD-10-CM | POA: Diagnosis not present

## 2021-05-18 DIAGNOSIS — R739 Hyperglycemia, unspecified: Secondary | ICD-10-CM | POA: Diagnosis not present

## 2021-05-18 DIAGNOSIS — Z79899 Other long term (current) drug therapy: Secondary | ICD-10-CM | POA: Diagnosis not present

## 2021-05-18 DIAGNOSIS — C221 Intrahepatic bile duct carcinoma: Secondary | ICD-10-CM | POA: Diagnosis not present

## 2021-05-18 DIAGNOSIS — Z9889 Other specified postprocedural states: Secondary | ICD-10-CM | POA: Diagnosis not present

## 2021-05-21 DIAGNOSIS — Z7984 Long term (current) use of oral hypoglycemic drugs: Secondary | ICD-10-CM | POA: Diagnosis not present

## 2021-05-21 DIAGNOSIS — R161 Splenomegaly, not elsewhere classified: Secondary | ICD-10-CM | POA: Diagnosis not present

## 2021-05-21 DIAGNOSIS — E109 Type 1 diabetes mellitus without complications: Secondary | ICD-10-CM | POA: Diagnosis not present

## 2021-05-21 DIAGNOSIS — C221 Intrahepatic bile duct carcinoma: Secondary | ICD-10-CM | POA: Diagnosis not present

## 2021-05-21 DIAGNOSIS — Z9889 Other specified postprocedural states: Secondary | ICD-10-CM | POA: Diagnosis not present

## 2021-05-21 DIAGNOSIS — D759 Disease of blood and blood-forming organs, unspecified: Secondary | ICD-10-CM | POA: Diagnosis not present

## 2021-05-21 DIAGNOSIS — C8510 Unspecified B-cell lymphoma, unspecified site: Secondary | ICD-10-CM | POA: Diagnosis not present

## 2021-05-21 DIAGNOSIS — Z87442 Personal history of urinary calculi: Secondary | ICD-10-CM | POA: Diagnosis not present

## 2021-05-21 DIAGNOSIS — C8303 Small cell B-cell lymphoma, intra-abdominal lymph nodes: Secondary | ICD-10-CM | POA: Diagnosis not present

## 2021-05-25 DIAGNOSIS — Z483 Aftercare following surgery for neoplasm: Secondary | ICD-10-CM | POA: Diagnosis not present

## 2021-05-25 DIAGNOSIS — C221 Intrahepatic bile duct carcinoma: Secondary | ICD-10-CM | POA: Diagnosis not present

## 2021-05-25 DIAGNOSIS — Z8572 Personal history of non-Hodgkin lymphomas: Secondary | ICD-10-CM | POA: Diagnosis not present

## 2021-05-25 DIAGNOSIS — K432 Incisional hernia without obstruction or gangrene: Secondary | ICD-10-CM | POA: Diagnosis not present

## 2021-06-10 DIAGNOSIS — N202 Calculus of kidney with calculus of ureter: Secondary | ICD-10-CM | POA: Diagnosis not present

## 2021-07-06 DIAGNOSIS — E1040 Type 1 diabetes mellitus with diabetic neuropathy, unspecified: Secondary | ICD-10-CM | POA: Diagnosis not present

## 2021-07-08 DIAGNOSIS — L570 Actinic keratosis: Secondary | ICD-10-CM | POA: Diagnosis not present

## 2021-07-08 DIAGNOSIS — I10 Essential (primary) hypertension: Secondary | ICD-10-CM | POA: Diagnosis not present

## 2021-07-08 DIAGNOSIS — E1040 Type 1 diabetes mellitus with diabetic neuropathy, unspecified: Secondary | ICD-10-CM | POA: Diagnosis not present

## 2021-07-08 DIAGNOSIS — C44319 Basal cell carcinoma of skin of other parts of face: Secondary | ICD-10-CM | POA: Diagnosis not present

## 2021-08-11 DIAGNOSIS — Z794 Long term (current) use of insulin: Secondary | ICD-10-CM | POA: Diagnosis not present

## 2021-08-11 DIAGNOSIS — E109 Type 1 diabetes mellitus without complications: Secondary | ICD-10-CM | POA: Diagnosis not present

## 2021-08-21 DIAGNOSIS — E109 Type 1 diabetes mellitus without complications: Secondary | ICD-10-CM | POA: Diagnosis not present

## 2021-08-21 DIAGNOSIS — Z794 Long term (current) use of insulin: Secondary | ICD-10-CM | POA: Diagnosis not present

## 2021-08-27 DIAGNOSIS — C8307 Small cell B-cell lymphoma, spleen: Secondary | ICD-10-CM | POA: Diagnosis not present

## 2021-08-27 DIAGNOSIS — D759 Disease of blood and blood-forming organs, unspecified: Secondary | ICD-10-CM | POA: Diagnosis not present

## 2021-08-27 DIAGNOSIS — R161 Splenomegaly, not elsewhere classified: Secondary | ICD-10-CM | POA: Diagnosis not present

## 2021-08-31 DIAGNOSIS — C44319 Basal cell carcinoma of skin of other parts of face: Secondary | ICD-10-CM | POA: Diagnosis not present

## 2021-08-31 DIAGNOSIS — L578 Other skin changes due to chronic exposure to nonionizing radiation: Secondary | ICD-10-CM | POA: Diagnosis not present

## 2021-08-31 DIAGNOSIS — C44329 Squamous cell carcinoma of skin of other parts of face: Secondary | ICD-10-CM | POA: Diagnosis not present

## 2021-08-31 DIAGNOSIS — C4441 Basal cell carcinoma of skin of scalp and neck: Secondary | ICD-10-CM | POA: Diagnosis not present

## 2021-08-31 DIAGNOSIS — L570 Actinic keratosis: Secondary | ICD-10-CM | POA: Diagnosis not present

## 2021-08-31 DIAGNOSIS — L820 Inflamed seborrheic keratosis: Secondary | ICD-10-CM | POA: Diagnosis not present

## 2021-08-31 DIAGNOSIS — Z9889 Other specified postprocedural states: Secondary | ICD-10-CM | POA: Diagnosis not present

## 2021-08-31 DIAGNOSIS — Z85828 Personal history of other malignant neoplasm of skin: Secondary | ICD-10-CM | POA: Diagnosis not present

## 2021-08-31 DIAGNOSIS — D485 Neoplasm of uncertain behavior of skin: Secondary | ICD-10-CM | POA: Diagnosis not present

## 2021-09-23 DIAGNOSIS — E109 Type 1 diabetes mellitus without complications: Secondary | ICD-10-CM | POA: Diagnosis not present

## 2021-09-23 DIAGNOSIS — H401131 Primary open-angle glaucoma, bilateral, mild stage: Secondary | ICD-10-CM | POA: Diagnosis not present

## 2021-09-23 DIAGNOSIS — E119 Type 2 diabetes mellitus without complications: Secondary | ICD-10-CM | POA: Diagnosis not present

## 2021-09-23 DIAGNOSIS — H11003 Unspecified pterygium of eye, bilateral: Secondary | ICD-10-CM | POA: Diagnosis not present

## 2021-09-23 DIAGNOSIS — H35371 Puckering of macula, right eye: Secondary | ICD-10-CM | POA: Diagnosis not present

## 2021-09-23 DIAGNOSIS — Z794 Long term (current) use of insulin: Secondary | ICD-10-CM | POA: Diagnosis not present

## 2021-09-23 DIAGNOSIS — Z961 Presence of intraocular lens: Secondary | ICD-10-CM | POA: Diagnosis not present

## 2021-10-14 DIAGNOSIS — E1040 Type 1 diabetes mellitus with diabetic neuropathy, unspecified: Secondary | ICD-10-CM | POA: Diagnosis not present

## 2021-11-04 DIAGNOSIS — C4441 Basal cell carcinoma of skin of scalp and neck: Secondary | ICD-10-CM | POA: Diagnosis not present

## 2021-11-04 DIAGNOSIS — C44319 Basal cell carcinoma of skin of other parts of face: Secondary | ICD-10-CM | POA: Diagnosis not present

## 2021-11-09 DIAGNOSIS — R590 Localized enlarged lymph nodes: Secondary | ICD-10-CM | POA: Diagnosis not present

## 2021-11-09 DIAGNOSIS — Z9049 Acquired absence of other specified parts of digestive tract: Secondary | ICD-10-CM | POA: Diagnosis not present

## 2021-11-09 DIAGNOSIS — K769 Liver disease, unspecified: Secondary | ICD-10-CM | POA: Diagnosis not present

## 2021-11-09 DIAGNOSIS — R161 Splenomegaly, not elsewhere classified: Secondary | ICD-10-CM | POA: Diagnosis not present

## 2021-11-09 DIAGNOSIS — R599 Enlarged lymph nodes, unspecified: Secondary | ICD-10-CM | POA: Diagnosis not present

## 2021-11-09 DIAGNOSIS — Z90411 Acquired partial absence of pancreas: Secondary | ICD-10-CM | POA: Diagnosis not present

## 2021-11-09 DIAGNOSIS — C221 Intrahepatic bile duct carcinoma: Secondary | ICD-10-CM | POA: Diagnosis not present

## 2021-11-12 DIAGNOSIS — Z794 Long term (current) use of insulin: Secondary | ICD-10-CM | POA: Diagnosis not present

## 2021-11-12 DIAGNOSIS — D649 Anemia, unspecified: Secondary | ICD-10-CM | POA: Diagnosis not present

## 2021-11-12 DIAGNOSIS — C8598 Non-Hodgkin lymphoma, unspecified, lymph nodes of multiple sites: Secondary | ICD-10-CM | POA: Diagnosis not present

## 2021-11-12 DIAGNOSIS — C8300 Small cell B-cell lymphoma, unspecified site: Secondary | ICD-10-CM | POA: Diagnosis not present

## 2021-11-12 DIAGNOSIS — C8307 Small cell B-cell lymphoma, spleen: Secondary | ICD-10-CM | POA: Diagnosis not present

## 2021-11-12 DIAGNOSIS — C240 Malignant neoplasm of extrahepatic bile duct: Secondary | ICD-10-CM | POA: Diagnosis not present

## 2021-11-12 DIAGNOSIS — D72819 Decreased white blood cell count, unspecified: Secondary | ICD-10-CM | POA: Diagnosis not present

## 2021-11-12 DIAGNOSIS — Z808 Family history of malignant neoplasm of other organs or systems: Secondary | ICD-10-CM | POA: Diagnosis not present

## 2021-11-12 DIAGNOSIS — R161 Splenomegaly, not elsewhere classified: Secondary | ICD-10-CM | POA: Diagnosis not present

## 2021-11-12 DIAGNOSIS — D759 Disease of blood and blood-forming organs, unspecified: Secondary | ICD-10-CM | POA: Diagnosis not present

## 2021-11-12 DIAGNOSIS — D696 Thrombocytopenia, unspecified: Secondary | ICD-10-CM | POA: Diagnosis not present

## 2021-11-12 DIAGNOSIS — E109 Type 1 diabetes mellitus without complications: Secondary | ICD-10-CM | POA: Diagnosis not present

## 2021-11-23 DIAGNOSIS — Z9889 Other specified postprocedural states: Secondary | ICD-10-CM | POA: Diagnosis not present

## 2021-11-23 DIAGNOSIS — K432 Incisional hernia without obstruction or gangrene: Secondary | ICD-10-CM | POA: Diagnosis not present

## 2021-11-23 DIAGNOSIS — C221 Intrahepatic bile duct carcinoma: Secondary | ICD-10-CM | POA: Diagnosis not present

## 2021-11-23 DIAGNOSIS — Z8572 Personal history of non-Hodgkin lymphomas: Secondary | ICD-10-CM | POA: Diagnosis not present

## 2021-11-23 DIAGNOSIS — K439 Ventral hernia without obstruction or gangrene: Secondary | ICD-10-CM | POA: Diagnosis not present

## 2021-11-27 DIAGNOSIS — Z23 Encounter for immunization: Secondary | ICD-10-CM | POA: Diagnosis not present

## 2021-11-28 DIAGNOSIS — E7800 Pure hypercholesterolemia, unspecified: Secondary | ICD-10-CM | POA: Diagnosis not present

## 2021-11-28 DIAGNOSIS — I10 Essential (primary) hypertension: Secondary | ICD-10-CM | POA: Diagnosis not present

## 2021-11-28 DIAGNOSIS — E1040 Type 1 diabetes mellitus with diabetic neuropathy, unspecified: Secondary | ICD-10-CM | POA: Diagnosis not present

## 2021-12-17 DIAGNOSIS — R972 Elevated prostate specific antigen [PSA]: Secondary | ICD-10-CM | POA: Diagnosis not present

## 2021-12-17 DIAGNOSIS — N138 Other obstructive and reflux uropathy: Secondary | ICD-10-CM | POA: Diagnosis not present

## 2021-12-17 DIAGNOSIS — N200 Calculus of kidney: Secondary | ICD-10-CM | POA: Diagnosis not present

## 2021-12-17 DIAGNOSIS — N401 Enlarged prostate with lower urinary tract symptoms: Secondary | ICD-10-CM | POA: Diagnosis not present

## 2022-01-04 DIAGNOSIS — E1040 Type 1 diabetes mellitus with diabetic neuropathy, unspecified: Secondary | ICD-10-CM | POA: Diagnosis not present

## 2022-01-07 DIAGNOSIS — Z0000 Encounter for general adult medical examination without abnormal findings: Secondary | ICD-10-CM | POA: Diagnosis not present

## 2022-01-07 DIAGNOSIS — D709 Neutropenia, unspecified: Secondary | ICD-10-CM | POA: Diagnosis not present

## 2022-01-07 DIAGNOSIS — I10 Essential (primary) hypertension: Secondary | ICD-10-CM | POA: Diagnosis not present

## 2022-01-07 DIAGNOSIS — E1040 Type 1 diabetes mellitus with diabetic neuropathy, unspecified: Secondary | ICD-10-CM | POA: Diagnosis not present

## 2022-01-26 DIAGNOSIS — Z794 Long term (current) use of insulin: Secondary | ICD-10-CM | POA: Diagnosis not present

## 2022-01-26 DIAGNOSIS — E109 Type 1 diabetes mellitus without complications: Secondary | ICD-10-CM | POA: Diagnosis not present

## 2022-02-12 DIAGNOSIS — H5213 Myopia, bilateral: Secondary | ICD-10-CM | POA: Diagnosis not present

## 2022-02-21 DIAGNOSIS — E109 Type 1 diabetes mellitus without complications: Secondary | ICD-10-CM | POA: Diagnosis not present

## 2022-02-21 DIAGNOSIS — Z794 Long term (current) use of insulin: Secondary | ICD-10-CM | POA: Diagnosis not present

## 2022-02-23 DIAGNOSIS — Z9221 Personal history of antineoplastic chemotherapy: Secondary | ICD-10-CM | POA: Diagnosis not present

## 2022-02-23 DIAGNOSIS — R7309 Other abnormal glucose: Secondary | ICD-10-CM | POA: Diagnosis not present

## 2022-02-23 DIAGNOSIS — Z9049 Acquired absence of other specified parts of digestive tract: Secondary | ICD-10-CM | POA: Diagnosis not present

## 2022-02-23 DIAGNOSIS — C8580 Other specified types of non-Hodgkin lymphoma, unspecified site: Secondary | ICD-10-CM | POA: Diagnosis not present

## 2022-02-23 DIAGNOSIS — C8307 Small cell B-cell lymphoma, spleen: Secondary | ICD-10-CM | POA: Diagnosis not present

## 2022-02-23 DIAGNOSIS — R161 Splenomegaly, not elsewhere classified: Secondary | ICD-10-CM | POA: Diagnosis not present

## 2022-02-23 DIAGNOSIS — C221 Intrahepatic bile duct carcinoma: Secondary | ICD-10-CM | POA: Diagnosis not present

## 2022-02-23 DIAGNOSIS — D759 Disease of blood and blood-forming organs, unspecified: Secondary | ICD-10-CM | POA: Diagnosis not present

## 2022-03-25 DIAGNOSIS — N481 Balanitis: Secondary | ICD-10-CM | POA: Diagnosis not present

## 2022-03-25 DIAGNOSIS — R972 Elevated prostate specific antigen [PSA]: Secondary | ICD-10-CM | POA: Diagnosis not present

## 2022-03-25 DIAGNOSIS — N1339 Other hydronephrosis: Secondary | ICD-10-CM | POA: Diagnosis not present

## 2022-03-25 DIAGNOSIS — R3129 Other microscopic hematuria: Secondary | ICD-10-CM | POA: Diagnosis not present

## 2022-03-25 DIAGNOSIS — N401 Enlarged prostate with lower urinary tract symptoms: Secondary | ICD-10-CM | POA: Diagnosis not present

## 2022-03-25 DIAGNOSIS — N200 Calculus of kidney: Secondary | ICD-10-CM | POA: Diagnosis not present

## 2022-04-21 DIAGNOSIS — E109 Type 1 diabetes mellitus without complications: Secondary | ICD-10-CM | POA: Diagnosis not present

## 2022-04-21 DIAGNOSIS — Z794 Long term (current) use of insulin: Secondary | ICD-10-CM | POA: Diagnosis not present

## 2022-04-28 DIAGNOSIS — Z794 Long term (current) use of insulin: Secondary | ICD-10-CM | POA: Diagnosis not present

## 2022-04-28 DIAGNOSIS — E109 Type 1 diabetes mellitus without complications: Secondary | ICD-10-CM | POA: Diagnosis not present

## 2022-05-05 DIAGNOSIS — E109 Type 1 diabetes mellitus without complications: Secondary | ICD-10-CM | POA: Diagnosis not present

## 2022-05-11 DIAGNOSIS — R590 Localized enlarged lymph nodes: Secondary | ICD-10-CM | POA: Diagnosis not present

## 2022-05-11 DIAGNOSIS — R161 Splenomegaly, not elsewhere classified: Secondary | ICD-10-CM | POA: Diagnosis not present

## 2022-05-11 DIAGNOSIS — K8689 Other specified diseases of pancreas: Secondary | ICD-10-CM | POA: Diagnosis not present

## 2022-05-11 DIAGNOSIS — C221 Intrahepatic bile duct carcinoma: Secondary | ICD-10-CM | POA: Diagnosis not present

## 2022-05-12 DIAGNOSIS — E109 Type 1 diabetes mellitus without complications: Secondary | ICD-10-CM | POA: Diagnosis not present

## 2022-05-12 DIAGNOSIS — Z794 Long term (current) use of insulin: Secondary | ICD-10-CM | POA: Diagnosis not present

## 2022-05-13 DIAGNOSIS — C221 Intrahepatic bile duct carcinoma: Secondary | ICD-10-CM | POA: Diagnosis not present

## 2022-05-19 DIAGNOSIS — E109 Type 1 diabetes mellitus without complications: Secondary | ICD-10-CM | POA: Diagnosis not present

## 2022-05-19 DIAGNOSIS — Z794 Long term (current) use of insulin: Secondary | ICD-10-CM | POA: Diagnosis not present

## 2022-05-26 DIAGNOSIS — E109 Type 1 diabetes mellitus without complications: Secondary | ICD-10-CM | POA: Diagnosis not present

## 2022-06-02 DIAGNOSIS — Z794 Long term (current) use of insulin: Secondary | ICD-10-CM | POA: Diagnosis not present

## 2022-06-02 DIAGNOSIS — E109 Type 1 diabetes mellitus without complications: Secondary | ICD-10-CM | POA: Diagnosis not present

## 2022-06-09 DIAGNOSIS — E109 Type 1 diabetes mellitus without complications: Secondary | ICD-10-CM | POA: Diagnosis not present

## 2022-06-09 DIAGNOSIS — Z794 Long term (current) use of insulin: Secondary | ICD-10-CM | POA: Diagnosis not present

## 2022-06-16 DIAGNOSIS — E109 Type 1 diabetes mellitus without complications: Secondary | ICD-10-CM | POA: Diagnosis not present

## 2022-06-16 DIAGNOSIS — Z794 Long term (current) use of insulin: Secondary | ICD-10-CM | POA: Diagnosis not present

## 2022-06-21 DIAGNOSIS — K080 Exfoliation of teeth due to systemic causes: Secondary | ICD-10-CM | POA: Diagnosis not present

## 2022-06-23 DIAGNOSIS — E109 Type 1 diabetes mellitus without complications: Secondary | ICD-10-CM | POA: Diagnosis not present

## 2022-06-23 DIAGNOSIS — Z794 Long term (current) use of insulin: Secondary | ICD-10-CM | POA: Diagnosis not present

## 2022-06-28 DIAGNOSIS — E1040 Type 1 diabetes mellitus with diabetic neuropathy, unspecified: Secondary | ICD-10-CM | POA: Diagnosis not present

## 2022-06-30 DIAGNOSIS — E109 Type 1 diabetes mellitus without complications: Secondary | ICD-10-CM | POA: Diagnosis not present

## 2022-06-30 DIAGNOSIS — Z794 Long term (current) use of insulin: Secondary | ICD-10-CM | POA: Diagnosis not present

## 2022-07-05 DIAGNOSIS — Z794 Long term (current) use of insulin: Secondary | ICD-10-CM | POA: Diagnosis not present

## 2022-07-05 DIAGNOSIS — E109 Type 1 diabetes mellitus without complications: Secondary | ICD-10-CM | POA: Diagnosis not present

## 2022-07-13 DIAGNOSIS — Z794 Long term (current) use of insulin: Secondary | ICD-10-CM | POA: Diagnosis not present

## 2022-07-13 DIAGNOSIS — E109 Type 1 diabetes mellitus without complications: Secondary | ICD-10-CM | POA: Diagnosis not present

## 2022-07-20 DIAGNOSIS — Z794 Long term (current) use of insulin: Secondary | ICD-10-CM | POA: Diagnosis not present

## 2022-07-20 DIAGNOSIS — E109 Type 1 diabetes mellitus without complications: Secondary | ICD-10-CM | POA: Diagnosis not present

## 2022-07-27 DIAGNOSIS — Z794 Long term (current) use of insulin: Secondary | ICD-10-CM | POA: Diagnosis not present

## 2022-07-27 DIAGNOSIS — E109 Type 1 diabetes mellitus without complications: Secondary | ICD-10-CM | POA: Diagnosis not present

## 2022-08-02 DIAGNOSIS — C8307 Small cell B-cell lymphoma, spleen: Secondary | ICD-10-CM | POA: Diagnosis not present

## 2022-08-03 DIAGNOSIS — Z794 Long term (current) use of insulin: Secondary | ICD-10-CM | POA: Diagnosis not present

## 2022-08-03 DIAGNOSIS — E109 Type 1 diabetes mellitus without complications: Secondary | ICD-10-CM | POA: Diagnosis not present

## 2022-08-04 DIAGNOSIS — G620 Drug-induced polyneuropathy: Secondary | ICD-10-CM | POA: Diagnosis not present

## 2022-08-10 DIAGNOSIS — Z794 Long term (current) use of insulin: Secondary | ICD-10-CM | POA: Diagnosis not present

## 2022-08-10 DIAGNOSIS — E109 Type 1 diabetes mellitus without complications: Secondary | ICD-10-CM | POA: Diagnosis not present

## 2022-08-17 DIAGNOSIS — Z794 Long term (current) use of insulin: Secondary | ICD-10-CM | POA: Diagnosis not present

## 2022-08-17 DIAGNOSIS — E109 Type 1 diabetes mellitus without complications: Secondary | ICD-10-CM | POA: Diagnosis not present

## 2022-08-18 DIAGNOSIS — H401131 Primary open-angle glaucoma, bilateral, mild stage: Secondary | ICD-10-CM | POA: Diagnosis not present

## 2022-08-18 DIAGNOSIS — H35371 Puckering of macula, right eye: Secondary | ICD-10-CM | POA: Diagnosis not present

## 2022-08-24 DIAGNOSIS — E109 Type 1 diabetes mellitus without complications: Secondary | ICD-10-CM | POA: Diagnosis not present

## 2022-08-24 DIAGNOSIS — Z794 Long term (current) use of insulin: Secondary | ICD-10-CM | POA: Diagnosis not present

## 2022-08-31 DIAGNOSIS — E109 Type 1 diabetes mellitus without complications: Secondary | ICD-10-CM | POA: Diagnosis not present

## 2022-08-31 DIAGNOSIS — Z794 Long term (current) use of insulin: Secondary | ICD-10-CM | POA: Diagnosis not present

## 2022-09-07 DIAGNOSIS — E109 Type 1 diabetes mellitus without complications: Secondary | ICD-10-CM | POA: Diagnosis not present

## 2022-09-07 DIAGNOSIS — Z794 Long term (current) use of insulin: Secondary | ICD-10-CM | POA: Diagnosis not present

## 2022-09-14 DIAGNOSIS — E109 Type 1 diabetes mellitus without complications: Secondary | ICD-10-CM | POA: Diagnosis not present

## 2022-09-14 DIAGNOSIS — Z794 Long term (current) use of insulin: Secondary | ICD-10-CM | POA: Diagnosis not present

## 2022-09-21 DIAGNOSIS — E109 Type 1 diabetes mellitus without complications: Secondary | ICD-10-CM | POA: Diagnosis not present

## 2022-09-21 DIAGNOSIS — Z794 Long term (current) use of insulin: Secondary | ICD-10-CM | POA: Diagnosis not present

## 2022-09-29 DIAGNOSIS — H401131 Primary open-angle glaucoma, bilateral, mild stage: Secondary | ICD-10-CM | POA: Diagnosis not present

## 2022-09-29 DIAGNOSIS — Z961 Presence of intraocular lens: Secondary | ICD-10-CM | POA: Diagnosis not present

## 2022-09-29 DIAGNOSIS — H11003 Unspecified pterygium of eye, bilateral: Secondary | ICD-10-CM | POA: Diagnosis not present

## 2022-09-29 DIAGNOSIS — E119 Type 2 diabetes mellitus without complications: Secondary | ICD-10-CM | POA: Diagnosis not present

## 2022-09-29 DIAGNOSIS — H35371 Puckering of macula, right eye: Secondary | ICD-10-CM | POA: Diagnosis not present

## 2022-10-01 DIAGNOSIS — E109 Type 1 diabetes mellitus without complications: Secondary | ICD-10-CM | POA: Diagnosis not present

## 2022-10-01 DIAGNOSIS — Z794 Long term (current) use of insulin: Secondary | ICD-10-CM | POA: Diagnosis not present

## 2022-10-08 DIAGNOSIS — Z794 Long term (current) use of insulin: Secondary | ICD-10-CM | POA: Diagnosis not present

## 2022-10-08 DIAGNOSIS — E109 Type 1 diabetes mellitus without complications: Secondary | ICD-10-CM | POA: Diagnosis not present

## 2022-10-15 DIAGNOSIS — Z794 Long term (current) use of insulin: Secondary | ICD-10-CM | POA: Diagnosis not present

## 2022-10-15 DIAGNOSIS — E109 Type 1 diabetes mellitus without complications: Secondary | ICD-10-CM | POA: Diagnosis not present

## 2022-10-22 DIAGNOSIS — Z794 Long term (current) use of insulin: Secondary | ICD-10-CM | POA: Diagnosis not present

## 2022-10-22 DIAGNOSIS — E109 Type 1 diabetes mellitus without complications: Secondary | ICD-10-CM | POA: Diagnosis not present

## 2022-10-29 DIAGNOSIS — Z794 Long term (current) use of insulin: Secondary | ICD-10-CM | POA: Diagnosis not present

## 2022-10-29 DIAGNOSIS — E109 Type 1 diabetes mellitus without complications: Secondary | ICD-10-CM | POA: Diagnosis not present

## 2022-11-04 DIAGNOSIS — L814 Other melanin hyperpigmentation: Secondary | ICD-10-CM | POA: Diagnosis not present

## 2022-11-04 DIAGNOSIS — C4491 Basal cell carcinoma of skin, unspecified: Secondary | ICD-10-CM | POA: Diagnosis not present

## 2022-11-04 DIAGNOSIS — L821 Other seborrheic keratosis: Secondary | ICD-10-CM | POA: Diagnosis not present

## 2022-11-04 DIAGNOSIS — L570 Actinic keratosis: Secondary | ICD-10-CM | POA: Diagnosis not present

## 2022-11-05 DIAGNOSIS — E109 Type 1 diabetes mellitus without complications: Secondary | ICD-10-CM | POA: Diagnosis not present

## 2022-11-05 DIAGNOSIS — Z23 Encounter for immunization: Secondary | ICD-10-CM | POA: Diagnosis not present

## 2022-11-05 DIAGNOSIS — Z794 Long term (current) use of insulin: Secondary | ICD-10-CM | POA: Diagnosis not present

## 2022-11-11 DIAGNOSIS — R972 Elevated prostate specific antigen [PSA]: Secondary | ICD-10-CM | POA: Diagnosis not present

## 2022-11-11 DIAGNOSIS — N401 Enlarged prostate with lower urinary tract symptoms: Secondary | ICD-10-CM | POA: Diagnosis not present

## 2022-11-11 DIAGNOSIS — N200 Calculus of kidney: Secondary | ICD-10-CM | POA: Diagnosis not present

## 2022-11-11 DIAGNOSIS — N138 Other obstructive and reflux uropathy: Secondary | ICD-10-CM | POA: Diagnosis not present

## 2022-11-12 DIAGNOSIS — Z794 Long term (current) use of insulin: Secondary | ICD-10-CM | POA: Diagnosis not present

## 2022-11-12 DIAGNOSIS — E109 Type 1 diabetes mellitus without complications: Secondary | ICD-10-CM | POA: Diagnosis not present

## 2022-11-19 DIAGNOSIS — Z794 Long term (current) use of insulin: Secondary | ICD-10-CM | POA: Diagnosis not present

## 2022-11-19 DIAGNOSIS — E109 Type 1 diabetes mellitus without complications: Secondary | ICD-10-CM | POA: Diagnosis not present

## 2022-11-26 DIAGNOSIS — Z794 Long term (current) use of insulin: Secondary | ICD-10-CM | POA: Diagnosis not present

## 2022-11-26 DIAGNOSIS — E109 Type 1 diabetes mellitus without complications: Secondary | ICD-10-CM | POA: Diagnosis not present

## 2022-11-29 DIAGNOSIS — R161 Splenomegaly, not elsewhere classified: Secondary | ICD-10-CM | POA: Diagnosis not present

## 2022-11-29 DIAGNOSIS — R591 Generalized enlarged lymph nodes: Secondary | ICD-10-CM | POA: Diagnosis not present

## 2022-11-29 DIAGNOSIS — K769 Liver disease, unspecified: Secondary | ICD-10-CM | POA: Diagnosis not present

## 2022-11-29 DIAGNOSIS — Z08 Encounter for follow-up examination after completed treatment for malignant neoplasm: Secondary | ICD-10-CM | POA: Diagnosis not present

## 2022-11-29 DIAGNOSIS — C221 Intrahepatic bile duct carcinoma: Secondary | ICD-10-CM | POA: Diagnosis not present

## 2022-11-29 DIAGNOSIS — Z90411 Acquired partial absence of pancreas: Secondary | ICD-10-CM | POA: Diagnosis not present

## 2022-11-29 DIAGNOSIS — Z8509 Personal history of malignant neoplasm of other digestive organs: Secondary | ICD-10-CM | POA: Diagnosis not present

## 2022-11-29 DIAGNOSIS — K432 Incisional hernia without obstruction or gangrene: Secondary | ICD-10-CM | POA: Diagnosis not present

## 2022-12-02 DIAGNOSIS — C221 Intrahepatic bile duct carcinoma: Secondary | ICD-10-CM | POA: Diagnosis not present

## 2022-12-03 DIAGNOSIS — Z794 Long term (current) use of insulin: Secondary | ICD-10-CM | POA: Diagnosis not present

## 2022-12-03 DIAGNOSIS — E109 Type 1 diabetes mellitus without complications: Secondary | ICD-10-CM | POA: Diagnosis not present

## 2022-12-08 DIAGNOSIS — C221 Intrahepatic bile duct carcinoma: Secondary | ICD-10-CM | POA: Diagnosis not present

## 2022-12-09 DIAGNOSIS — Z90411 Acquired partial absence of pancreas: Secondary | ICD-10-CM | POA: Diagnosis not present

## 2022-12-09 DIAGNOSIS — C221 Intrahepatic bile duct carcinoma: Secondary | ICD-10-CM | POA: Diagnosis not present

## 2022-12-10 DIAGNOSIS — E109 Type 1 diabetes mellitus without complications: Secondary | ICD-10-CM | POA: Diagnosis not present

## 2022-12-10 DIAGNOSIS — Z794 Long term (current) use of insulin: Secondary | ICD-10-CM | POA: Diagnosis not present

## 2022-12-21 DIAGNOSIS — Z794 Long term (current) use of insulin: Secondary | ICD-10-CM | POA: Diagnosis not present

## 2022-12-21 DIAGNOSIS — E109 Type 1 diabetes mellitus without complications: Secondary | ICD-10-CM | POA: Diagnosis not present

## 2022-12-28 DIAGNOSIS — Z794 Long term (current) use of insulin: Secondary | ICD-10-CM | POA: Diagnosis not present

## 2022-12-28 DIAGNOSIS — E109 Type 1 diabetes mellitus without complications: Secondary | ICD-10-CM | POA: Diagnosis not present

## 2022-12-29 DIAGNOSIS — K080 Exfoliation of teeth due to systemic causes: Secondary | ICD-10-CM | POA: Diagnosis not present

## 2023-01-04 DIAGNOSIS — E109 Type 1 diabetes mellitus without complications: Secondary | ICD-10-CM | POA: Diagnosis not present

## 2023-01-04 DIAGNOSIS — Z794 Long term (current) use of insulin: Secondary | ICD-10-CM | POA: Diagnosis not present

## 2023-01-07 DIAGNOSIS — E1040 Type 1 diabetes mellitus with diabetic neuropathy, unspecified: Secondary | ICD-10-CM | POA: Diagnosis not present

## 2023-01-07 DIAGNOSIS — E7800 Pure hypercholesterolemia, unspecified: Secondary | ICD-10-CM | POA: Diagnosis not present

## 2023-01-11 DIAGNOSIS — Z794 Long term (current) use of insulin: Secondary | ICD-10-CM | POA: Diagnosis not present

## 2023-01-11 DIAGNOSIS — E109 Type 1 diabetes mellitus without complications: Secondary | ICD-10-CM | POA: Diagnosis not present

## 2023-01-14 DIAGNOSIS — I10 Essential (primary) hypertension: Secondary | ICD-10-CM | POA: Diagnosis not present

## 2023-01-14 DIAGNOSIS — Z0000 Encounter for general adult medical examination without abnormal findings: Secondary | ICD-10-CM | POA: Diagnosis not present

## 2023-01-14 DIAGNOSIS — E7800 Pure hypercholesterolemia, unspecified: Secondary | ICD-10-CM | POA: Diagnosis not present

## 2023-01-18 DIAGNOSIS — E109 Type 1 diabetes mellitus without complications: Secondary | ICD-10-CM | POA: Diagnosis not present

## 2023-01-18 DIAGNOSIS — Z794 Long term (current) use of insulin: Secondary | ICD-10-CM | POA: Diagnosis not present

## 2023-01-25 DIAGNOSIS — E109 Type 1 diabetes mellitus without complications: Secondary | ICD-10-CM | POA: Diagnosis not present

## 2023-01-25 DIAGNOSIS — Z794 Long term (current) use of insulin: Secondary | ICD-10-CM | POA: Diagnosis not present

## 2023-01-31 DIAGNOSIS — C221 Intrahepatic bile duct carcinoma: Secondary | ICD-10-CM | POA: Diagnosis not present

## 2023-01-31 DIAGNOSIS — C8307 Small cell B-cell lymphoma, spleen: Secondary | ICD-10-CM | POA: Diagnosis not present

## 2023-02-01 DIAGNOSIS — Z794 Long term (current) use of insulin: Secondary | ICD-10-CM | POA: Diagnosis not present

## 2023-02-01 DIAGNOSIS — E109 Type 1 diabetes mellitus without complications: Secondary | ICD-10-CM | POA: Diagnosis not present

## 2023-02-08 DIAGNOSIS — E109 Type 1 diabetes mellitus without complications: Secondary | ICD-10-CM | POA: Diagnosis not present

## 2023-02-08 DIAGNOSIS — Z794 Long term (current) use of insulin: Secondary | ICD-10-CM | POA: Diagnosis not present

## 2023-02-15 DIAGNOSIS — Z794 Long term (current) use of insulin: Secondary | ICD-10-CM | POA: Diagnosis not present

## 2023-02-15 DIAGNOSIS — E109 Type 1 diabetes mellitus without complications: Secondary | ICD-10-CM | POA: Diagnosis not present

## 2023-03-01 DIAGNOSIS — E109 Type 1 diabetes mellitus without complications: Secondary | ICD-10-CM | POA: Diagnosis not present

## 2023-03-01 DIAGNOSIS — Z794 Long term (current) use of insulin: Secondary | ICD-10-CM | POA: Diagnosis not present

## 2023-03-08 DIAGNOSIS — Z794 Long term (current) use of insulin: Secondary | ICD-10-CM | POA: Diagnosis not present

## 2023-03-08 DIAGNOSIS — E109 Type 1 diabetes mellitus without complications: Secondary | ICD-10-CM | POA: Diagnosis not present

## 2023-03-18 DIAGNOSIS — E109 Type 1 diabetes mellitus without complications: Secondary | ICD-10-CM | POA: Diagnosis not present

## 2023-03-18 DIAGNOSIS — Z794 Long term (current) use of insulin: Secondary | ICD-10-CM | POA: Diagnosis not present

## 2023-03-25 DIAGNOSIS — E109 Type 1 diabetes mellitus without complications: Secondary | ICD-10-CM | POA: Diagnosis not present

## 2023-03-25 DIAGNOSIS — Z794 Long term (current) use of insulin: Secondary | ICD-10-CM | POA: Diagnosis not present

## 2023-04-01 DIAGNOSIS — Z794 Long term (current) use of insulin: Secondary | ICD-10-CM | POA: Diagnosis not present

## 2023-04-05 DIAGNOSIS — M810 Age-related osteoporosis without current pathological fracture: Secondary | ICD-10-CM | POA: Diagnosis not present

## 2023-04-08 DIAGNOSIS — Z794 Long term (current) use of insulin: Secondary | ICD-10-CM | POA: Diagnosis not present

## 2023-04-15 DIAGNOSIS — Z794 Long term (current) use of insulin: Secondary | ICD-10-CM | POA: Diagnosis not present

## 2023-04-22 DIAGNOSIS — Z794 Long term (current) use of insulin: Secondary | ICD-10-CM | POA: Diagnosis not present

## 2023-04-22 DIAGNOSIS — E109 Type 1 diabetes mellitus without complications: Secondary | ICD-10-CM | POA: Diagnosis not present

## 2023-04-29 DIAGNOSIS — Z794 Long term (current) use of insulin: Secondary | ICD-10-CM | POA: Diagnosis not present

## 2023-05-02 DIAGNOSIS — C221 Intrahepatic bile duct carcinoma: Secondary | ICD-10-CM | POA: Diagnosis not present

## 2023-05-06 DIAGNOSIS — Z794 Long term (current) use of insulin: Secondary | ICD-10-CM | POA: Diagnosis not present

## 2023-05-06 DIAGNOSIS — E109 Type 1 diabetes mellitus without complications: Secondary | ICD-10-CM | POA: Diagnosis not present

## 2023-05-13 DIAGNOSIS — E109 Type 1 diabetes mellitus without complications: Secondary | ICD-10-CM | POA: Diagnosis not present

## 2023-05-13 DIAGNOSIS — Z794 Long term (current) use of insulin: Secondary | ICD-10-CM | POA: Diagnosis not present

## 2023-05-16 DIAGNOSIS — N200 Calculus of kidney: Secondary | ICD-10-CM | POA: Diagnosis not present

## 2023-05-16 DIAGNOSIS — E108 Type 1 diabetes mellitus with unspecified complications: Secondary | ICD-10-CM | POA: Diagnosis not present

## 2023-05-16 DIAGNOSIS — C8590 Non-Hodgkin lymphoma, unspecified, unspecified site: Secondary | ICD-10-CM | POA: Diagnosis not present

## 2023-05-16 DIAGNOSIS — M810 Age-related osteoporosis without current pathological fracture: Secondary | ICD-10-CM | POA: Diagnosis not present

## 2023-05-16 DIAGNOSIS — D696 Thrombocytopenia, unspecified: Secondary | ICD-10-CM | POA: Diagnosis not present

## 2023-05-16 DIAGNOSIS — R161 Splenomegaly, not elsewhere classified: Secondary | ICD-10-CM | POA: Diagnosis not present

## 2023-05-17 DIAGNOSIS — M810 Age-related osteoporosis without current pathological fracture: Secondary | ICD-10-CM | POA: Diagnosis not present

## 2023-05-20 DIAGNOSIS — Z794 Long term (current) use of insulin: Secondary | ICD-10-CM | POA: Diagnosis not present

## 2023-05-20 DIAGNOSIS — E109 Type 1 diabetes mellitus without complications: Secondary | ICD-10-CM | POA: Diagnosis not present

## 2023-05-27 DIAGNOSIS — E109 Type 1 diabetes mellitus without complications: Secondary | ICD-10-CM | POA: Diagnosis not present

## 2023-05-27 DIAGNOSIS — Z794 Long term (current) use of insulin: Secondary | ICD-10-CM | POA: Diagnosis not present

## 2023-06-03 DIAGNOSIS — Z794 Long term (current) use of insulin: Secondary | ICD-10-CM | POA: Diagnosis not present

## 2023-06-08 DIAGNOSIS — L570 Actinic keratosis: Secondary | ICD-10-CM | POA: Diagnosis not present

## 2023-06-08 DIAGNOSIS — C4491 Basal cell carcinoma of skin, unspecified: Secondary | ICD-10-CM | POA: Diagnosis not present

## 2023-06-08 DIAGNOSIS — L578 Other skin changes due to chronic exposure to nonionizing radiation: Secondary | ICD-10-CM | POA: Diagnosis not present

## 2023-06-09 DIAGNOSIS — N138 Other obstructive and reflux uropathy: Secondary | ICD-10-CM | POA: Diagnosis not present

## 2023-06-09 DIAGNOSIS — N401 Enlarged prostate with lower urinary tract symptoms: Secondary | ICD-10-CM | POA: Diagnosis not present

## 2023-06-09 DIAGNOSIS — N200 Calculus of kidney: Secondary | ICD-10-CM | POA: Diagnosis not present

## 2023-06-16 DIAGNOSIS — Z794 Long term (current) use of insulin: Secondary | ICD-10-CM | POA: Diagnosis not present

## 2023-06-16 DIAGNOSIS — E109 Type 1 diabetes mellitus without complications: Secondary | ICD-10-CM | POA: Diagnosis not present

## 2023-06-23 DIAGNOSIS — Z794 Long term (current) use of insulin: Secondary | ICD-10-CM | POA: Diagnosis not present

## 2023-06-23 DIAGNOSIS — E109 Type 1 diabetes mellitus without complications: Secondary | ICD-10-CM | POA: Diagnosis not present

## 2023-06-30 DIAGNOSIS — E109 Type 1 diabetes mellitus without complications: Secondary | ICD-10-CM | POA: Diagnosis not present

## 2023-06-30 DIAGNOSIS — M810 Age-related osteoporosis without current pathological fracture: Secondary | ICD-10-CM | POA: Diagnosis not present

## 2023-06-30 DIAGNOSIS — Z79899 Other long term (current) drug therapy: Secondary | ICD-10-CM | POA: Diagnosis not present

## 2023-06-30 DIAGNOSIS — Z794 Long term (current) use of insulin: Secondary | ICD-10-CM | POA: Diagnosis not present

## 2023-07-01 DIAGNOSIS — E1040 Type 1 diabetes mellitus with diabetic neuropathy, unspecified: Secondary | ICD-10-CM | POA: Diagnosis not present

## 2023-07-05 DIAGNOSIS — E1040 Type 1 diabetes mellitus with diabetic neuropathy, unspecified: Secondary | ICD-10-CM | POA: Diagnosis not present

## 2023-07-05 DIAGNOSIS — Z9049 Acquired absence of other specified parts of digestive tract: Secondary | ICD-10-CM | POA: Diagnosis not present

## 2023-07-05 DIAGNOSIS — Z90410 Acquired total absence of pancreas: Secondary | ICD-10-CM | POA: Diagnosis not present

## 2023-07-07 DIAGNOSIS — E109 Type 1 diabetes mellitus without complications: Secondary | ICD-10-CM | POA: Diagnosis not present

## 2023-07-07 DIAGNOSIS — Z794 Long term (current) use of insulin: Secondary | ICD-10-CM | POA: Diagnosis not present

## 2023-07-14 DIAGNOSIS — E109 Type 1 diabetes mellitus without complications: Secondary | ICD-10-CM | POA: Diagnosis not present

## 2023-07-14 DIAGNOSIS — Z794 Long term (current) use of insulin: Secondary | ICD-10-CM | POA: Diagnosis not present

## 2023-07-21 DIAGNOSIS — Z794 Long term (current) use of insulin: Secondary | ICD-10-CM | POA: Diagnosis not present

## 2023-07-21 DIAGNOSIS — E109 Type 1 diabetes mellitus without complications: Secondary | ICD-10-CM | POA: Diagnosis not present

## 2023-07-27 DIAGNOSIS — Z794 Long term (current) use of insulin: Secondary | ICD-10-CM | POA: Diagnosis not present

## 2023-07-27 DIAGNOSIS — M810 Age-related osteoporosis without current pathological fracture: Secondary | ICD-10-CM | POA: Diagnosis not present

## 2023-07-28 DIAGNOSIS — E109 Type 1 diabetes mellitus without complications: Secondary | ICD-10-CM | POA: Diagnosis not present

## 2023-07-28 DIAGNOSIS — Z794 Long term (current) use of insulin: Secondary | ICD-10-CM | POA: Diagnosis not present

## 2023-08-01 DIAGNOSIS — C8307 Small cell B-cell lymphoma, spleen: Secondary | ICD-10-CM | POA: Diagnosis not present

## 2023-08-01 DIAGNOSIS — C221 Intrahepatic bile duct carcinoma: Secondary | ICD-10-CM | POA: Diagnosis not present

## 2023-08-03 DIAGNOSIS — M810 Age-related osteoporosis without current pathological fracture: Secondary | ICD-10-CM | POA: Diagnosis not present

## 2023-08-04 DIAGNOSIS — E109 Type 1 diabetes mellitus without complications: Secondary | ICD-10-CM | POA: Diagnosis not present

## 2023-08-04 DIAGNOSIS — Z794 Long term (current) use of insulin: Secondary | ICD-10-CM | POA: Diagnosis not present

## 2023-08-11 DIAGNOSIS — Z794 Long term (current) use of insulin: Secondary | ICD-10-CM | POA: Diagnosis not present

## 2023-08-11 DIAGNOSIS — E109 Type 1 diabetes mellitus without complications: Secondary | ICD-10-CM | POA: Diagnosis not present

## 2023-08-16 DIAGNOSIS — M810 Age-related osteoporosis without current pathological fracture: Secondary | ICD-10-CM | POA: Diagnosis not present

## 2023-08-16 DIAGNOSIS — Z5181 Encounter for therapeutic drug level monitoring: Secondary | ICD-10-CM | POA: Diagnosis not present

## 2023-08-18 DIAGNOSIS — Z794 Long term (current) use of insulin: Secondary | ICD-10-CM | POA: Diagnosis not present

## 2023-08-18 DIAGNOSIS — E109 Type 1 diabetes mellitus without complications: Secondary | ICD-10-CM | POA: Diagnosis not present

## 2023-08-25 DIAGNOSIS — E109 Type 1 diabetes mellitus without complications: Secondary | ICD-10-CM | POA: Diagnosis not present

## 2023-08-25 DIAGNOSIS — Z794 Long term (current) use of insulin: Secondary | ICD-10-CM | POA: Diagnosis not present

## 2023-09-01 DIAGNOSIS — Z794 Long term (current) use of insulin: Secondary | ICD-10-CM | POA: Diagnosis not present

## 2023-09-01 DIAGNOSIS — K080 Exfoliation of teeth due to systemic causes: Secondary | ICD-10-CM | POA: Diagnosis not present

## 2023-09-01 DIAGNOSIS — E109 Type 1 diabetes mellitus without complications: Secondary | ICD-10-CM | POA: Diagnosis not present

## 2023-09-08 DIAGNOSIS — Z794 Long term (current) use of insulin: Secondary | ICD-10-CM | POA: Diagnosis not present

## 2023-09-08 DIAGNOSIS — E109 Type 1 diabetes mellitus without complications: Secondary | ICD-10-CM | POA: Diagnosis not present

## 2023-09-14 DIAGNOSIS — H401131 Primary open-angle glaucoma, bilateral, mild stage: Secondary | ICD-10-CM | POA: Diagnosis not present

## 2023-09-14 DIAGNOSIS — Z794 Long term (current) use of insulin: Secondary | ICD-10-CM | POA: Diagnosis not present

## 2023-09-14 DIAGNOSIS — H35371 Puckering of macula, right eye: Secondary | ICD-10-CM | POA: Diagnosis not present

## 2023-09-14 DIAGNOSIS — Z961 Presence of intraocular lens: Secondary | ICD-10-CM | POA: Diagnosis not present

## 2023-09-14 DIAGNOSIS — E119 Type 2 diabetes mellitus without complications: Secondary | ICD-10-CM | POA: Diagnosis not present

## 2023-09-14 DIAGNOSIS — H11003 Unspecified pterygium of eye, bilateral: Secondary | ICD-10-CM | POA: Diagnosis not present

## 2023-09-21 DIAGNOSIS — Z794 Long term (current) use of insulin: Secondary | ICD-10-CM | POA: Diagnosis not present

## 2023-09-28 DIAGNOSIS — D696 Thrombocytopenia, unspecified: Secondary | ICD-10-CM | POA: Diagnosis not present

## 2023-09-28 DIAGNOSIS — Z794 Long term (current) use of insulin: Secondary | ICD-10-CM | POA: Diagnosis not present

## 2023-10-05 DIAGNOSIS — Z794 Long term (current) use of insulin: Secondary | ICD-10-CM | POA: Diagnosis not present

## 2023-10-12 DIAGNOSIS — Z794 Long term (current) use of insulin: Secondary | ICD-10-CM | POA: Diagnosis not present

## 2023-10-18 DIAGNOSIS — E108 Type 1 diabetes mellitus with unspecified complications: Secondary | ICD-10-CM | POA: Diagnosis not present

## 2023-10-18 DIAGNOSIS — Z90410 Acquired total absence of pancreas: Secondary | ICD-10-CM | POA: Diagnosis not present

## 2023-10-18 DIAGNOSIS — C221 Intrahepatic bile duct carcinoma: Secondary | ICD-10-CM | POA: Diagnosis not present

## 2023-10-18 DIAGNOSIS — K8681 Exocrine pancreatic insufficiency: Secondary | ICD-10-CM | POA: Diagnosis not present

## 2023-10-19 DIAGNOSIS — E108 Type 1 diabetes mellitus with unspecified complications: Secondary | ICD-10-CM | POA: Diagnosis not present

## 2023-10-19 DIAGNOSIS — Z9049 Acquired absence of other specified parts of digestive tract: Secondary | ICD-10-CM | POA: Diagnosis not present

## 2023-10-19 DIAGNOSIS — K8681 Exocrine pancreatic insufficiency: Secondary | ICD-10-CM | POA: Diagnosis not present

## 2023-10-19 DIAGNOSIS — E559 Vitamin D deficiency, unspecified: Secondary | ICD-10-CM | POA: Diagnosis not present

## 2023-10-19 DIAGNOSIS — Z90410 Acquired total absence of pancreas: Secondary | ICD-10-CM | POA: Diagnosis not present

## 2023-10-19 DIAGNOSIS — Z794 Long term (current) use of insulin: Secondary | ICD-10-CM | POA: Diagnosis not present

## 2023-10-19 DIAGNOSIS — C221 Intrahepatic bile duct carcinoma: Secondary | ICD-10-CM | POA: Diagnosis not present

## 2023-10-24 DIAGNOSIS — E1040 Type 1 diabetes mellitus with diabetic neuropathy, unspecified: Secondary | ICD-10-CM | POA: Diagnosis not present

## 2023-10-26 DIAGNOSIS — E109 Type 1 diabetes mellitus without complications: Secondary | ICD-10-CM | POA: Diagnosis not present

## 2023-10-26 DIAGNOSIS — Z794 Long term (current) use of insulin: Secondary | ICD-10-CM | POA: Diagnosis not present

## 2023-11-02 DIAGNOSIS — Z794 Long term (current) use of insulin: Secondary | ICD-10-CM | POA: Diagnosis not present

## 2023-11-02 DIAGNOSIS — E109 Type 1 diabetes mellitus without complications: Secondary | ICD-10-CM | POA: Diagnosis not present

## 2023-11-09 DIAGNOSIS — E109 Type 1 diabetes mellitus without complications: Secondary | ICD-10-CM | POA: Diagnosis not present

## 2023-11-09 DIAGNOSIS — Z794 Long term (current) use of insulin: Secondary | ICD-10-CM | POA: Diagnosis not present

## 2023-11-16 DIAGNOSIS — Z794 Long term (current) use of insulin: Secondary | ICD-10-CM | POA: Diagnosis not present

## 2023-11-23 DIAGNOSIS — Z794 Long term (current) use of insulin: Secondary | ICD-10-CM | POA: Diagnosis not present

## 2023-11-29 DIAGNOSIS — M810 Age-related osteoporosis without current pathological fracture: Secondary | ICD-10-CM | POA: Diagnosis not present

## 2023-11-29 DIAGNOSIS — E109 Type 1 diabetes mellitus without complications: Secondary | ICD-10-CM | POA: Diagnosis not present

## 2023-11-29 DIAGNOSIS — Z794 Long term (current) use of insulin: Secondary | ICD-10-CM | POA: Diagnosis not present

## 2023-11-29 DIAGNOSIS — K7689 Other specified diseases of liver: Secondary | ICD-10-CM | POA: Diagnosis not present

## 2023-11-29 DIAGNOSIS — J9811 Atelectasis: Secondary | ICD-10-CM | POA: Diagnosis not present

## 2023-11-29 DIAGNOSIS — R918 Other nonspecific abnormal finding of lung field: Secondary | ICD-10-CM | POA: Diagnosis not present

## 2023-11-29 DIAGNOSIS — C221 Intrahepatic bile duct carcinoma: Secondary | ICD-10-CM | POA: Diagnosis not present

## 2023-11-29 DIAGNOSIS — C8590 Non-Hodgkin lymphoma, unspecified, unspecified site: Secondary | ICD-10-CM | POA: Diagnosis not present

## 2023-11-29 DIAGNOSIS — Z9641 Presence of insulin pump (external) (internal): Secondary | ICD-10-CM | POA: Diagnosis not present

## 2023-11-29 DIAGNOSIS — K432 Incisional hernia without obstruction or gangrene: Secondary | ICD-10-CM | POA: Diagnosis not present

## 2023-11-29 DIAGNOSIS — R590 Localized enlarged lymph nodes: Secondary | ICD-10-CM | POA: Diagnosis not present

## 2023-11-29 DIAGNOSIS — R161 Splenomegaly, not elsewhere classified: Secondary | ICD-10-CM | POA: Diagnosis not present

## 2023-11-29 DIAGNOSIS — N200 Calculus of kidney: Secondary | ICD-10-CM | POA: Diagnosis not present

## 2023-11-29 DIAGNOSIS — N281 Cyst of kidney, acquired: Secondary | ICD-10-CM | POA: Diagnosis not present

## 2023-11-30 DIAGNOSIS — E109 Type 1 diabetes mellitus without complications: Secondary | ICD-10-CM | POA: Diagnosis not present

## 2023-11-30 DIAGNOSIS — Z794 Long term (current) use of insulin: Secondary | ICD-10-CM | POA: Diagnosis not present

## 2023-12-02 DIAGNOSIS — Z23 Encounter for immunization: Secondary | ICD-10-CM | POA: Diagnosis not present

## 2023-12-06 DIAGNOSIS — H11003 Unspecified pterygium of eye, bilateral: Secondary | ICD-10-CM | POA: Diagnosis not present

## 2023-12-06 DIAGNOSIS — H401131 Primary open-angle glaucoma, bilateral, mild stage: Secondary | ICD-10-CM | POA: Diagnosis not present

## 2023-12-06 DIAGNOSIS — H35371 Puckering of macula, right eye: Secondary | ICD-10-CM | POA: Diagnosis not present

## 2023-12-06 DIAGNOSIS — Z961 Presence of intraocular lens: Secondary | ICD-10-CM | POA: Diagnosis not present

## 2023-12-06 DIAGNOSIS — E119 Type 2 diabetes mellitus without complications: Secondary | ICD-10-CM | POA: Diagnosis not present

## 2023-12-06 DIAGNOSIS — H04123 Dry eye syndrome of bilateral lacrimal glands: Secondary | ICD-10-CM | POA: Diagnosis not present

## 2023-12-07 DIAGNOSIS — Z794 Long term (current) use of insulin: Secondary | ICD-10-CM | POA: Diagnosis not present

## 2023-12-07 DIAGNOSIS — E109 Type 1 diabetes mellitus without complications: Secondary | ICD-10-CM | POA: Diagnosis not present

## 2023-12-22 DIAGNOSIS — C221 Intrahepatic bile duct carcinoma: Secondary | ICD-10-CM | POA: Diagnosis not present

## 2023-12-22 DIAGNOSIS — N401 Enlarged prostate with lower urinary tract symptoms: Secondary | ICD-10-CM | POA: Diagnosis not present

## 2023-12-22 DIAGNOSIS — N138 Other obstructive and reflux uropathy: Secondary | ICD-10-CM | POA: Diagnosis not present

## 2023-12-22 DIAGNOSIS — N200 Calculus of kidney: Secondary | ICD-10-CM | POA: Diagnosis not present

## 2023-12-22 DIAGNOSIS — C8307 Small cell B-cell lymphoma, spleen: Secondary | ICD-10-CM | POA: Diagnosis not present

## 2024-01-04 DIAGNOSIS — D485 Neoplasm of uncertain behavior of skin: Secondary | ICD-10-CM | POA: Diagnosis not present

## 2024-01-04 DIAGNOSIS — L578 Other skin changes due to chronic exposure to nonionizing radiation: Secondary | ICD-10-CM | POA: Diagnosis not present

## 2024-01-04 DIAGNOSIS — C4491 Basal cell carcinoma of skin, unspecified: Secondary | ICD-10-CM | POA: Diagnosis not present

## 2024-01-04 DIAGNOSIS — L570 Actinic keratosis: Secondary | ICD-10-CM | POA: Diagnosis not present

## 2024-01-04 DIAGNOSIS — Z85828 Personal history of other malignant neoplasm of skin: Secondary | ICD-10-CM | POA: Diagnosis not present

## 2024-01-04 DIAGNOSIS — D044 Carcinoma in situ of skin of scalp and neck: Secondary | ICD-10-CM | POA: Diagnosis not present

## 2024-01-12 DIAGNOSIS — E1040 Type 1 diabetes mellitus with diabetic neuropathy, unspecified: Secondary | ICD-10-CM | POA: Diagnosis not present

## 2024-01-12 DIAGNOSIS — I10 Essential (primary) hypertension: Secondary | ICD-10-CM | POA: Diagnosis not present

## 2024-01-18 DIAGNOSIS — E10319 Type 1 diabetes mellitus with unspecified diabetic retinopathy without macular edema: Secondary | ICD-10-CM | POA: Diagnosis not present

## 2024-01-18 DIAGNOSIS — I10 Essential (primary) hypertension: Secondary | ICD-10-CM | POA: Diagnosis not present

## 2024-01-18 DIAGNOSIS — M810 Age-related osteoporosis without current pathological fracture: Secondary | ICD-10-CM | POA: Diagnosis not present

## 2024-01-18 DIAGNOSIS — Z0000 Encounter for general adult medical examination without abnormal findings: Secondary | ICD-10-CM | POA: Diagnosis not present

## 2024-01-18 DIAGNOSIS — E1040 Type 1 diabetes mellitus with diabetic neuropathy, unspecified: Secondary | ICD-10-CM | POA: Diagnosis not present

## 2024-01-30 DIAGNOSIS — C8307 Small cell B-cell lymphoma, spleen: Secondary | ICD-10-CM | POA: Diagnosis not present

## 2024-02-08 DIAGNOSIS — Z5181 Encounter for therapeutic drug level monitoring: Secondary | ICD-10-CM | POA: Diagnosis not present

## 2024-02-08 DIAGNOSIS — M810 Age-related osteoporosis without current pathological fracture: Secondary | ICD-10-CM | POA: Diagnosis not present

## 2024-02-08 DIAGNOSIS — E559 Vitamin D deficiency, unspecified: Secondary | ICD-10-CM | POA: Diagnosis not present
